# Patient Record
Sex: Female | Born: 1945 | Race: Black or African American | Hispanic: No | Smoking: Former smoker
Health system: Southern US, Community
[De-identification: ages and names within clinical notes are randomized; demographics above are authoritative.]

## PROBLEM LIST (undated history)

## (undated) DIAGNOSIS — I639 Cerebral infarction, unspecified: Secondary | ICD-10-CM

## (undated) DIAGNOSIS — I10 Essential (primary) hypertension: Secondary | ICD-10-CM

## (undated) DIAGNOSIS — I701 Atherosclerosis of renal artery: Secondary | ICD-10-CM

## (undated) DIAGNOSIS — I2510 Atherosclerotic heart disease of native coronary artery without angina pectoris: Secondary | ICD-10-CM

## (undated) DIAGNOSIS — N186 End stage renal disease: Secondary | ICD-10-CM

## (undated) DIAGNOSIS — N185 Chronic kidney disease, stage 5: Secondary | ICD-10-CM

## (undated) DIAGNOSIS — E1122 Type 2 diabetes mellitus with diabetic chronic kidney disease: Secondary | ICD-10-CM

## (undated) DIAGNOSIS — I219 Acute myocardial infarction, unspecified: Secondary | ICD-10-CM

## (undated) DIAGNOSIS — M1990 Unspecified osteoarthritis, unspecified site: Secondary | ICD-10-CM

## (undated) HISTORY — PX: RENAL ARTERY ANGIOPLASTY: SHX2317

## (undated) HISTORY — PX: CORONARY ANGIOPLASTY WITH STENT PLACEMENT: SHX49

## (undated) HISTORY — PX: TOTAL VAGINAL HYSTERECTOMY: SHX2548

---

## 2012-01-27 ENCOUNTER — Ambulatory Visit: Payer: Self-pay | Admitting: Internal Medicine

## 2012-02-24 ENCOUNTER — Ambulatory Visit: Payer: Self-pay | Admitting: Internal Medicine

## 2012-04-02 ENCOUNTER — Emergency Department: Payer: Self-pay | Admitting: Unknown Physician Specialty

## 2012-04-02 LAB — CBC
HCT: 33 % — ABNORMAL LOW (ref 35.0–47.0)
HGB: 11 g/dL — ABNORMAL LOW (ref 12.0–16.0)
MCH: 31.1 pg (ref 26.0–34.0)
MCHC: 33.4 g/dL (ref 32.0–36.0)
Platelet: 204 10*3/uL (ref 150–440)
RDW: 14.3 % (ref 11.5–14.5)

## 2012-04-02 LAB — URINALYSIS, COMPLETE
Bacteria: NONE SEEN
Glucose,UR: 50 mg/dL (ref 0–75)
Leukocyte Esterase: NEGATIVE
Nitrite: NEGATIVE
RBC,UR: 1 /HPF (ref 0–5)
Specific Gravity: 1.01 (ref 1.003–1.030)
Squamous Epithelial: 4
WBC UR: 1 /HPF (ref 0–5)

## 2012-04-02 LAB — COMPREHENSIVE METABOLIC PANEL
Anion Gap: 8 (ref 7–16)
BUN: 36 mg/dL — ABNORMAL HIGH (ref 7–18)
Bilirubin,Total: 0.2 mg/dL (ref 0.2–1.0)
Calcium, Total: 9 mg/dL (ref 8.5–10.1)
Chloride: 110 mmol/L — ABNORMAL HIGH (ref 98–107)
Co2: 26 mmol/L (ref 21–32)
Creatinine: 2.52 mg/dL — ABNORMAL HIGH (ref 0.60–1.30)
EGFR (African American): 22 — ABNORMAL LOW
EGFR (Non-African Amer.): 19 — ABNORMAL LOW
Osmolality: 299 (ref 275–301)
Potassium: 3.8 mmol/L (ref 3.5–5.1)
SGPT (ALT): 17 U/L (ref 12–78)
Sodium: 144 mmol/L (ref 136–145)
Total Protein: 7.4 g/dL (ref 6.4–8.2)

## 2013-06-26 ENCOUNTER — Ambulatory Visit: Payer: Self-pay | Admitting: Internal Medicine

## 2013-06-26 LAB — RAPID INFLUENZA A&B ANTIGENS

## 2014-11-25 ENCOUNTER — Encounter: Payer: Self-pay | Admitting: Emergency Medicine

## 2014-11-25 ENCOUNTER — Emergency Department: Payer: Medicare Other

## 2014-11-25 ENCOUNTER — Observation Stay
Admission: EM | Admit: 2014-11-25 | Discharge: 2014-11-27 | Disposition: A | Discharge status: Home / Self Care | Payer: Medicare Other | Attending: Internal Medicine | Admitting: Internal Medicine

## 2014-11-25 DIAGNOSIS — Z882 Allergy status to sulfonamides status: Secondary | ICD-10-CM | POA: Diagnosis not present

## 2014-11-25 DIAGNOSIS — I701 Atherosclerosis of renal artery: Secondary | ICD-10-CM | POA: Insufficient documentation

## 2014-11-25 DIAGNOSIS — I2510 Atherosclerotic heart disease of native coronary artery without angina pectoris: Secondary | ICD-10-CM | POA: Diagnosis present

## 2014-11-25 DIAGNOSIS — R7989 Other specified abnormal findings of blood chemistry: Secondary | ICD-10-CM

## 2014-11-25 DIAGNOSIS — Z8249 Family history of ischemic heart disease and other diseases of the circulatory system: Secondary | ICD-10-CM | POA: Diagnosis not present

## 2014-11-25 DIAGNOSIS — Z803 Family history of malignant neoplasm of breast: Secondary | ICD-10-CM | POA: Diagnosis not present

## 2014-11-25 DIAGNOSIS — Z955 Presence of coronary angioplasty implant and graft: Secondary | ICD-10-CM | POA: Insufficient documentation

## 2014-11-25 DIAGNOSIS — Z823 Family history of stroke: Secondary | ICD-10-CM | POA: Insufficient documentation

## 2014-11-25 DIAGNOSIS — Z7982 Long term (current) use of aspirin: Secondary | ICD-10-CM | POA: Insufficient documentation

## 2014-11-25 DIAGNOSIS — R739 Hyperglycemia, unspecified: Secondary | ICD-10-CM | POA: Diagnosis present

## 2014-11-25 DIAGNOSIS — N289 Disorder of kidney and ureter, unspecified: Secondary | ICD-10-CM | POA: Diagnosis present

## 2014-11-25 DIAGNOSIS — I200 Unstable angina: Secondary | ICD-10-CM | POA: Diagnosis not present

## 2014-11-25 DIAGNOSIS — Z794 Long term (current) use of insulin: Secondary | ICD-10-CM | POA: Diagnosis not present

## 2014-11-25 DIAGNOSIS — E1165 Type 2 diabetes mellitus with hyperglycemia: Secondary | ICD-10-CM | POA: Diagnosis not present

## 2014-11-25 DIAGNOSIS — M79602 Pain in left arm: Secondary | ICD-10-CM | POA: Diagnosis not present

## 2014-11-25 DIAGNOSIS — I120 Hypertensive chronic kidney disease with stage 5 chronic kidney disease or end stage renal disease: Secondary | ICD-10-CM | POA: Insufficient documentation

## 2014-11-25 DIAGNOSIS — Z833 Family history of diabetes mellitus: Secondary | ICD-10-CM | POA: Diagnosis not present

## 2014-11-25 DIAGNOSIS — I252 Old myocardial infarction: Secondary | ICD-10-CM | POA: Diagnosis not present

## 2014-11-25 DIAGNOSIS — E785 Hyperlipidemia, unspecified: Secondary | ICD-10-CM | POA: Diagnosis not present

## 2014-11-25 DIAGNOSIS — I25110 Atherosclerotic heart disease of native coronary artery with unstable angina pectoris: Secondary | ICD-10-CM | POA: Diagnosis not present

## 2014-11-25 DIAGNOSIS — Z79899 Other long term (current) drug therapy: Secondary | ICD-10-CM | POA: Insufficient documentation

## 2014-11-25 DIAGNOSIS — Z8489 Family history of other specified conditions: Secondary | ICD-10-CM | POA: Insufficient documentation

## 2014-11-25 DIAGNOSIS — E1122 Type 2 diabetes mellitus with diabetic chronic kidney disease: Secondary | ICD-10-CM | POA: Diagnosis present

## 2014-11-25 DIAGNOSIS — Z888 Allergy status to other drugs, medicaments and biological substances status: Secondary | ICD-10-CM | POA: Diagnosis not present

## 2014-11-25 DIAGNOSIS — I10 Essential (primary) hypertension: Secondary | ICD-10-CM | POA: Diagnosis present

## 2014-11-25 DIAGNOSIS — Z87891 Personal history of nicotine dependence: Secondary | ICD-10-CM | POA: Diagnosis not present

## 2014-11-25 DIAGNOSIS — N186 End stage renal disease: Secondary | ICD-10-CM | POA: Insufficient documentation

## 2014-11-25 DIAGNOSIS — R778 Other specified abnormalities of plasma proteins: Secondary | ICD-10-CM

## 2014-11-25 DIAGNOSIS — Z8673 Personal history of transient ischemic attack (TIA), and cerebral infarction without residual deficits: Secondary | ICD-10-CM | POA: Diagnosis not present

## 2014-11-25 DIAGNOSIS — R079 Chest pain, unspecified: Secondary | ICD-10-CM

## 2014-11-25 HISTORY — DX: Atherosclerotic heart disease of native coronary artery without angina pectoris: I25.10

## 2014-11-25 HISTORY — DX: Chronic kidney disease, stage 5: N18.5

## 2014-11-25 HISTORY — DX: Type 2 diabetes mellitus with diabetic chronic kidney disease: E11.22

## 2014-11-25 HISTORY — DX: Cerebral infarction, unspecified: I63.9

## 2014-11-25 HISTORY — DX: Acute myocardial infarction, unspecified: I21.9

## 2014-11-25 HISTORY — DX: Atherosclerosis of renal artery: I70.1

## 2014-11-25 HISTORY — DX: End stage renal disease: N18.6

## 2014-11-25 HISTORY — DX: Unspecified osteoarthritis, unspecified site: M19.90

## 2014-11-25 HISTORY — DX: Essential (primary) hypertension: I10

## 2014-11-25 LAB — CBC
HCT: 29.4 % — ABNORMAL LOW (ref 35.0–47.0)
Hemoglobin: 9.5 g/dL — ABNORMAL LOW (ref 12.0–16.0)
MCH: 29.2 pg (ref 26.0–34.0)
MCHC: 32.2 g/dL (ref 32.0–36.0)
MCV: 90.7 fL (ref 80.0–100.0)
Platelets: 159 10*3/uL (ref 150–440)
RBC: 3.24 MIL/uL — AB (ref 3.80–5.20)
RDW: 15.6 % — ABNORMAL HIGH (ref 11.5–14.5)
WBC: 3.5 10*3/uL — AB (ref 3.6–11.0)

## 2014-11-25 LAB — PROTIME-INR
INR: 1
PROTHROMBIN TIME: 13.4 s (ref 11.4–15.0)

## 2014-11-25 MED ORDER — NITROGLYCERIN 0.4 MG SL SUBL: 0.4000 mg | SUBLINGUAL_TABLET | SUBLINGUAL | Status: DC | PRN

## 2014-11-25 MED ORDER — NITROGLYCERIN 2 % TD OINT
1.0000 [in_us] | TOPICAL_OINTMENT | Freq: Once | TRANSDERMAL | Status: AC
  Administered 2014-11-26: 1 [in_us] via TOPICAL

## 2014-11-25 NOTE — ED Notes (Signed)
Pt presents to ED via EMS from personal home with c/o of mid-sternal chest pain radiating to left side of chest/shoulder. EMS states pt has a hx of an MI. EMS states pt has been experiencing these sx intermittently since earlier today. EMS states pt describes pain as sharp/dull, with a rating of 5/10. EMS administered x1 nitroglycerin sublingual tablet and 324 mg of aspirin in route to ER, with a declining pain rating. VS per EMS listed below:   310 CBG  92 HR  98.4 Temperature  96% O2 RA  160/82 BP

## 2014-11-25 NOTE — ED Provider Notes (Signed)
[facility] Emergency Department Provider Note  ____________________________________________  Time seen: Approximately 11:18 PM  I have reviewed the triage vital signs and the nursing notes.   HISTORY  Chief Complaint Chest Pain    HPI Loretta Kaiser is a 69 y.o. female who presents via EMS for chest pain. Patient states she was getting ready for bed when she experienced gradual onset left-sided chest pressure prior to arrival. Patient rates pain 10/10 prior to EMS arrival. CP associated with shortness of breath; patient denies associated diaphoresis, nausea, dizziness, palpitations. Patient received 324 mg aspirin and 1 sublingual nitroglycerin per EMS with partial relief, radiating CP now 5/10. Patient states pain is similar to her prior MI.  Past medical history . CAD (coronary artery disease) 2004  . Renal artery stenosis (RAF-HCC) 2007  . Prolapsed uterus  . Type II diabetes mellitus (RAF-HCC)  . Hypertension  . Renal artery stenosis (RAF-HCC)   Patient Active Problem List   Diagnosis Date Noted  . Unstable angina 11/26/2014  . 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 CAD (coronary artery disease), native coronary artery 11/26/2014  . Benign essential HTN 11/26/2014  . HLD (hyperlipidemia) 11/26/2014    Past surgical history S/p 2007 Left renal artery angioplasty w/stent placement  S/p March 2004 MI & subsequent angioplasty w/stent placement   Medications . amLODIPine (NORVASC) 10 MG tablet Take 1 tablet (10 mg total) by mouth daily. 90 tablet 1  . aspirin (ECOTRIN) 81 MG tablet Take 81 mg by mouth daily. Take 81 mg by mouth daily.  . blood sugar diagnostic (ONE TOUCH ULTRA TEST) Strp Apply 1 each topically Three (3) times a day. (Use to test blood sugar ICD 250.00)(Pt on Insulin)  . blood-glucose meter (ONE TOUCH ULTRA SYSTEM KIT) kit Apply 1 each topically daily. Use as instructed  . carvedilol (COREG) 12.5 MG tablet  Take 1 tablet (12.5 mg total) by mouth Two (2) times a day. 180 tablet 3  . coenzyme Q10 (CO Q-10) 100 mg capsule Take 100 mg by mouth once daily.  Marland Kitchen donepezil (ARICEPT) 5 MG tablet Take 1 tablet (5 mg total) by mouth nightly. 90 tablet 3  . fluticasone (FLONASE) 50 mcg/actuation nasal spray USE ONE SPRAY IN EACH NOSTRIL ONCE DAILY 16 g 1  . furosemide (LASIX) 20 MG tablet Take 1 tablet (20 mg total) by mouth daily. 30 tablet 11  . insulin glargine (LANTUS) 100 unit/mL (3 mL) injection pen Inject 40 units under skin nightly 15 mL 4  . insulin lispro (HUMALOG KWIKPEN) 100 unit/mL injection pen Inject 12 units under skin three times daily before meals 10 mL 12  . insulin needles, disposable, (PEN NEEDLE) 31 X 1/4 " Ndle Take as directed 100 each 2  . lancets Misc  . mirtazapine (REMERON) 15 MG tablet Take 1 tab by mouth at bedtime 90 tablet 1  . multivitamin (THERAGRAN) per tablet Take 1 tablet by mouth once daily.  Marland Kitchen omega-3 fatty acids-fish oil (FISH OIL EXTRA STRENGTH) 435-880 mg cap Take 3 tablets by mouth daily. Frequency:QD Dosage:0.0 Instructions: Note:Dose: ONE TABLET  . rosuvastatin (CRESTOR) 20 MG tablet Take 1 tablet (20 mg total) by mouth daily. 90 tablet 3  . sodium bicarbonate 650 mg tablet Take 2 tablets (1,300 mg total) by mouth Two (2) times a day. 120 tablet 11   Allergies Sulfa (sulfonamide antibiotics); Sulfur; Ace inhibitors; Arb-angiotensin receptor antagonist; Lipitor; and Simvastatin  Family history CAD   Social History History  Substance Use Topics  . Smoking status: Former Smoker    Quit date: 06/28/1986  . Smokeless tobacco: Not on file  . Alcohol Use: No  Former smoker  Review of Systems Constitutional: No fever/chills Eyes: No visual changes. ENT: No sore throat. Cardiovascular: Positive for chest pain. Respiratory: Positive for shortness of breath. Gastrointestinal: No abdominal pain.  No nausea, no vomiting.  No diarrhea.  No  constipation. Genitourinary: Negative for dysuria. Musculoskeletal: Negative for back pain. Positive for BLE swelling. Skin: Negative for rash. Neurological: Negative for headaches, focal weakness or numbness.  10-point ROS otherwise negative.  ____________________________________________   PHYSICAL EXAM:  VITAL SIGNS: ED Triage Vitals  Enc Vitals Group     BP --      Pulse --      Resp --      Temp --      Temp src --      SpO2 --      Weight --      Height --      Head Cir --      Peak Flow --      Pain Score --      Pain Loc --      Pain Edu? --      Excl. in [place]? --     Constitutional: Alert and oriented. Well appearing and in no acute distress. Eyes: Conjunctivae are normal. PERRL. EOMI. Head: Atraumatic. Nose: No congestion/rhinnorhea. Mouth/Throat: Mucous membranes are moist.  Oropharynx non-erythematous. Neck: No stridor.   Cardiovascular: Normal rate, regular rhythm. Grossly normal heart sounds.  Good peripheral circulation. Respiratory: Normal respiratory effort.  No retractions. Lungs CTAB. Gastrointestinal: Soft and nontender. No distention. No abdominal bruits. No CVA tenderness. Musculoskeletal: No lower extremity tenderness nor edema.  No joint effusions. Neurologic:  Normal speech and language. No gross focal neurologic deficits are appreciated. Speech is normal. No gait instability. Skin:  Skin is warm, dry and intact. No rash noted. Psychiatric: Mood and affect are normal. Speech and behavior are normal.  ____________________________________________   LABS (all labs ordered are listed, but only abnormal results are displayed)  Labs Reviewed  BASIC METABOLIC PANEL - Abnormal; Notable for the following:    Glucose, Bld 357 (*)    BUN 49 (*)    Creatinine, Ser 5.48 (*)    Calcium 8.3 (*)    GFR calc non Af Amer 7 (*)    GFR calc Af Amer 8 (*)    All other components within normal limits  CBC - Abnormal; Notable for the following:    WBC 3.5  (*)    RBC 3.24 (*)    Hemoglobin 9.5 (*)    HCT 29.4 (*)    RDW 15.6 (*)    All other components within normal limits  TROPONIN I - Abnormal; Notable for the following:    Troponin I 0.05 (*)    All other components within normal limits  GLUCOSE, CAPILLARY - Abnormal; Notable for the following:    Glucose-Capillary 251 (*)    All other components within normal limits  PROTIME-INR  TROPONIN I  TROPONIN I  TROPONIN I  BASIC METABOLIC PANEL  CBC   ____________________________________________  EKG  ED ECG REPORT I, SUNG,JADE J, the attending physician, personally viewed and interpreted this ECG.   Date: 11/25/2014  EKG Time: 2319  Rate: 82  Rhythm: normal EKG, normal sinus rhythm  Axis: Normal  Intervals:first-degree A-V block   ST&T Change: Nonspecific  ____________________________________________  RADIOLOGY  Portable chest x-ray (viewed by me, interpreted per Dr. Marisue Humble): Cardiomegaly with mild vascular congestion. ____________________________________________   PROCEDURES  Procedure(s) performed: None  Critical Care performed: No  ____________________________________________   INITIAL IMPRESSION / ASSESSMENT AND PLAN / ED COURSE  Pertinent labs & imaging results that were available during my care of the patient were reviewed by me and considered in my medical decision making (see chart for details).  69 year old female who presents with left-sided chest pressure similar to prior MI. Pain currently 5/10 after aspirin and 1 sublingual nitroglycerin per EMS. Will continue nitroglycerin for relief of chest discomfort, check labs including troponin, chest x-ray; anticipate admission for chest pain rule out. ____________________________________________   FINAL CLINICAL IMPRESSION(S) / ED DIAGNOSES  Final diagnoses:  Chest pain, unspecified chest pain type  Renal insufficiency  Elevated troponin  Hyperglycemia      Paulette Blanch, MD 11/26/14 [phone]

## 2014-11-26 ENCOUNTER — Encounter: Payer: Self-pay | Admitting: Internal Medicine

## 2014-11-26 ENCOUNTER — Observation Stay
Admit: 2014-11-26 | Discharge: 2014-11-26 | Disposition: A | Discharge status: Home / Self Care | Payer: Medicare Other | Attending: Internal Medicine | Admitting: Internal Medicine

## 2014-11-26 DIAGNOSIS — E785 Hyperlipidemia, unspecified: Secondary | ICD-10-CM | POA: Diagnosis present

## 2014-11-26 DIAGNOSIS — I10 Essential (primary) hypertension: Secondary | ICD-10-CM | POA: Diagnosis present

## 2014-11-26 DIAGNOSIS — I200 Unstable angina: Secondary | ICD-10-CM | POA: Diagnosis present

## 2014-11-26 DIAGNOSIS — E1122 Type 2 diabetes mellitus with diabetic chronic kidney disease: Secondary | ICD-10-CM | POA: Diagnosis present

## 2014-11-26 DIAGNOSIS — I2510 Atherosclerotic heart disease of native coronary artery without angina pectoris: Secondary | ICD-10-CM | POA: Diagnosis present

## 2014-11-26 DIAGNOSIS — N186 End stage renal disease: Secondary | ICD-10-CM

## 2014-11-26 LAB — BASIC METABOLIC PANEL
ANION GAP: 5 (ref 5–15)
Anion gap: 9 (ref 5–15)
BUN: 49 mg/dL — ABNORMAL HIGH (ref 6–20)
BUN: 47 mg/dL — ABNORMAL HIGH (ref 6–20)
CO2: 24 mmol/L (ref 22–32)
CO2: 23 mmol/L (ref 22–32)
CREATININE: 5.02 mg/dL — AB (ref 0.44–1.00)
Calcium: 8.3 mg/dL — ABNORMAL LOW (ref 8.9–10.3)
Calcium: 8.3 mg/dL — ABNORMAL LOW (ref 8.9–10.3)
Chloride: 106 mmol/L (ref 101–111)
Chloride: 111 mmol/L (ref 101–111)
Creatinine, Ser: 5.48 mg/dL — ABNORMAL HIGH (ref 0.44–1.00)
GFR calc Af Amer: 8 mL/min — ABNORMAL LOW (ref >60)
GFR calc non Af Amer: 8 mL/min — ABNORMAL LOW (ref >60)
GFR, EST AFRICAN AMERICAN: 9 mL/min — AB (ref >60)
GFR, EST NON AFRICAN AMERICAN: 7 mL/min — AB (ref >60)
Glucose, Bld: 357 mg/dL — ABNORMAL HIGH (ref 65–99)
Glucose, Bld: 251 mg/dL — ABNORMAL HIGH (ref 65–99)
Potassium: 4.4 mmol/L (ref 3.5–5.1)
Potassium: 4.5 mmol/L (ref 3.5–5.1)
SODIUM: 139 mmol/L (ref 135–145)
Sodium: 139 mmol/L (ref 135–145)

## 2014-11-26 LAB — CBC
HEMATOCRIT: 30.2 % — AB (ref 35.0–47.0)
HEMOGLOBIN: 9.6 g/dL — AB (ref 12.0–16.0)
MCH: 29 pg (ref 26.0–34.0)
MCHC: 31.9 g/dL — ABNORMAL LOW (ref 32.0–36.0)
MCV: 91 fL (ref 80.0–100.0)
Platelets: 158 10*3/uL (ref 150–440)
RBC: 3.32 MIL/uL — AB (ref 3.80–5.20)
RDW: 15.5 % — ABNORMAL HIGH (ref 11.5–14.5)
WBC: 4.3 10*3/uL (ref 3.6–11.0)

## 2014-11-26 LAB — TROPONIN I
TROPONIN I: 0.05 ng/mL — AB (ref <0.031)
TROPONIN I: 0.77 ng/mL — AB (ref <0.031)
Troponin I: 0.7 ng/mL — ABNORMAL HIGH (ref <0.031)

## 2014-11-26 LAB — GLUCOSE, CAPILLARY
GLUCOSE-CAPILLARY: 251 mg/dL — AB (ref 65–99)
Glucose-Capillary: 145 mg/dL — ABNORMAL HIGH (ref 65–99)
Glucose-Capillary: 159 mg/dL — ABNORMAL HIGH (ref 65–99)
Glucose-Capillary: 164 mg/dL — ABNORMAL HIGH (ref 65–99)
Glucose-Capillary: 234 mg/dL — ABNORMAL HIGH (ref 65–99)
Glucose-Capillary: 170 mg/dL — ABNORMAL HIGH (ref 65–99)

## 2014-11-26 MED ORDER — DONEPEZIL HCL 5 MG PO TABS
5.0000 mg | ORAL_TABLET | Freq: Every day | ORAL | Status: DC
  Administered 2014-11-26: 5 mg via ORAL
  Filled 2014-11-26: qty 1

## 2014-11-26 MED ORDER — FUROSEMIDE 20 MG PO TABS
20.0000 mg | ORAL_TABLET | Freq: Every day | ORAL | Status: DC
  Administered 2014-11-26 – 2014-11-27 (×2): 20 mg via ORAL
  Filled 2014-11-26 (×2): qty 1

## 2014-11-26 MED ORDER — MIRTAZAPINE 15 MG PO TABS
15.0000 mg | ORAL_TABLET | Freq: Every day | ORAL | Status: DC
  Administered 2014-11-26: 15 mg via ORAL
  Filled 2014-11-26: qty 1

## 2014-11-26 MED ORDER — SENNOSIDES-DOCUSATE SODIUM 8.6-50 MG PO TABS
1.0000 | ORAL_TABLET | Freq: Every evening | ORAL | Status: DC | PRN
Start: 2014-11-26 — End: 2014-11-27

## 2014-11-26 MED ORDER — NITROGLYCERIN 2 % TD OINT
TOPICAL_OINTMENT | TRANSDERMAL | Status: AC
  Administered 2014-11-26: 1 [in_us] via TOPICAL
  Filled 2014-11-26: qty 1

## 2014-11-26 MED ORDER — INSULIN ASPART 100 UNIT/ML ~~LOC~~ SOLN
0.0000 [IU] | Freq: Four times a day (QID) | SUBCUTANEOUS | Status: DC
  Administered 2014-11-26: 3 [IU] via SUBCUTANEOUS
  Administered 2014-11-26 – 2014-11-27 (×3): 2 [IU] via SUBCUTANEOUS
  Filled 2014-11-26 (×3): qty 2
  Filled 2014-11-26: qty 3

## 2014-11-26 MED ORDER — ACETAMINOPHEN 650 MG RE SUPP: 650.0000 mg | Freq: Four times a day (QID) | RECTAL | Status: DC | PRN

## 2014-11-26 MED ORDER — INSULIN ASPART 100 UNIT/ML ~~LOC~~ SOLN
SUBCUTANEOUS | Status: AC
  Administered 2014-11-26: 10 [IU] via SUBCUTANEOUS
  Filled 2014-11-26: qty 10

## 2014-11-26 MED ORDER — AMLODIPINE BESYLATE 10 MG PO TABS
10.0000 mg | ORAL_TABLET | Freq: Every day | ORAL | Status: DC
  Administered 2014-11-26 – 2014-11-27 (×2): 10 mg via ORAL
  Filled 2014-11-26 (×2): qty 1

## 2014-11-26 MED ORDER — SIMETHICONE 80 MG PO CHEW
80.0000 mg | CHEWABLE_TABLET | Freq: Four times a day (QID) | ORAL | Status: DC
  Administered 2014-11-26 – 2014-11-27 (×3): 80 mg via ORAL
  Filled 2014-11-26 (×3): qty 1

## 2014-11-26 MED ORDER — ASPIRIN EC 81 MG PO TBEC
81.0000 mg | DELAYED_RELEASE_TABLET | Freq: Every day | ORAL | Status: DC
  Administered 2014-11-26 – 2014-11-27 (×2): 81 mg via ORAL
  Filled 2014-11-26 (×2): qty 1

## 2014-11-26 MED ORDER — ACETAMINOPHEN 325 MG PO TABS: 650.0000 mg | ORAL_TABLET | Freq: Four times a day (QID) | ORAL | Status: DC | PRN

## 2014-11-26 MED ORDER — ROSUVASTATIN CALCIUM 20 MG PO TABS
20.0000 mg | ORAL_TABLET | Freq: Every day | ORAL | Status: DC
  Administered 2014-11-26 – 2014-11-27 (×2): 20 mg via ORAL
  Filled 2014-11-26 (×2): qty 1

## 2014-11-26 MED ORDER — HEPARIN SODIUM (PORCINE) 5000 UNIT/ML IJ SOLN
5000.0000 [IU] | Freq: Three times a day (TID) | INTRAMUSCULAR | Status: DC
  Administered 2014-11-26 – 2014-11-27 (×4): 5000 [IU] via SUBCUTANEOUS
  Filled 2014-11-26 (×3): qty 1

## 2014-11-26 MED ORDER — CARVEDILOL 12.5 MG PO TABS
12.5000 mg | ORAL_TABLET | Freq: Two times a day (BID) | ORAL | Status: DC
  Administered 2014-11-26 – 2014-11-27 (×3): 12.5 mg via ORAL
  Filled 2014-11-26 (×3): qty 1

## 2014-11-26 MED ORDER — ONDANSETRON HCL 4 MG/2ML IJ SOLN: 4.0000 mg | Freq: Four times a day (QID) | INTRAMUSCULAR | Status: DC | PRN

## 2014-11-26 MED ORDER — ONDANSETRON HCL 4 MG PO TABS: 4.0000 mg | ORAL_TABLET | Freq: Four times a day (QID) | ORAL | Status: DC | PRN

## 2014-11-26 MED ORDER — ISOSORBIDE MONONITRATE ER 30 MG PO TB24
30.0000 mg | ORAL_TABLET | Freq: Every day | ORAL | Status: DC
  Administered 2014-11-27: 30 mg via ORAL
  Filled 2014-11-26: qty 1

## 2014-11-26 MED ORDER — INSULIN ASPART 100 UNIT/ML ~~LOC~~ SOLN
10.0000 [IU] | Freq: Once | SUBCUTANEOUS | Status: AC
  Administered 2014-11-26: 10 [IU] via SUBCUTANEOUS

## 2014-11-26 MED ORDER — SODIUM CHLORIDE 0.9 % IJ SOLN
3.0000 mL | Freq: Two times a day (BID) | INTRAMUSCULAR | Status: DC
  Administered 2014-11-26 – 2014-11-27 (×4): 3 mL via INTRAVENOUS

## 2014-11-26 NOTE — Progress Notes (Signed)
[facility] [place] [facility]   PATIENT NAME: Loretta Kaiser    MR#:  [id number]  DATE OF BIRTH:  1945-11-03  SUBJECTIVE:Admitted for chest pain.now chest pain free.troponin elevated at 0.70,  CHIEF COMPLAINT:   Chief Complaint  Patient presents with  . Chest Pain    REVIEW OF SYSTEMS:   ROS CONSTITUTIONAL: No fever, fatigue or weakness.  EYES: No blurred or double vision.  EARS, NOSE, AND THROAT: No tinnitus or ear pain.  RESPIRATORY: No cough, shortness of breath, wheezing or hemoptysis.  CARDIOVASCULAR: No chest pain, orthopnea, edema.  GASTROINTESTINAL: No nausea, vomiting, diarrhea or abdominal pain.  GENITOURINARY: No dysuria, hematuria.  ENDOCRINE: No polyuria, nocturia,  HEMATOLOGY: No anemia, easy bruising or bleeding SKIN: No rash or lesion. MUSCULOSKELETAL: No joint pain or arthritis.   NEUROLOGIC: No tingling, numbness, weakness.  PSYCHIATRY: No anxiety or depression.   DRUG ALLERGIES:   Allergies  Allergen Reactions  . Ace Inhibitors     hyperkalemia  . Angiotensin Receptor Blockers     hyperkalemia  . Lipitor [Atorvastatin]     Muscle aches  . Sulfa Antibiotics Hives  . Simvastatin Rash    VITALS:  Blood pressure 173/54, pulse 78, temperature 98.1 F (36.7 C), temperature source Oral, resp. rate 20, height 5\' 5"  (1.651 m), weight 79.379 kg (175 lb), SpO2 99 %.  PHYSICAL EXAMINATION:  GENERAL:  69 y.o.-year-old patient lying in the bed with no acute distress.  EYES: Pupils equal, round, reactive to light and accommodation. No scleral icterus. Extraocular muscles intact.  HEENT: Head atraumatic, normocephalic. Oropharynx and nasopharynx clear.  NECK:  Supple, no jugular venous distention. No thyroid enlargement, no tenderness.  LUNGS: Normal breath sounds bilaterally, no wheezing, rales,rhonchi or crepitation. No use of accessory muscles of respiration.  CARDIOVASCULAR: S1, S2 normal. No murmurs, rubs, or gallops.   ABDOMEN: Soft, nontender, nondistended. Bowel sounds present. No organomegaly or mass.  EXTREMITIES: No pedal edema, cyanosis, or clubbing.  NEUROLOGIC: Cranial nerves II through XII are intact. Muscle strength 5/5 in all extremities. Sensation intact. Gait not checked.  PSYCHIATRIC: The patient is alert and oriented x 3.  SKIN: No obvious rash, lesion, or ulcer.    LABORATORY PANEL:   CBC  Recent Labs Lab 11/25/14 2321  WBC 3.5*  HGB 9.5*  HCT 29.4*  PLT 159   ------------------------------------------------------------------------------------------------------------------  Chemistries   Recent Labs Lab 11/25/14 2321  NA 139  K 4.4  CL 106  CO2 24  GLUCOSE 357*  BUN 49*  CREATININE 5.48*  CALCIUM 8.3*   ------------------------------------------------------------------------------------------------------------------  Cardiac Enzymes  Recent Labs Lab 11/26/14 0701  TROPONINI 0.70*   ------------------------------------------------------------------------------------------------------------------  RADIOLOGY:  Dg Chest Port 1 View  11/26/2014   CLINICAL DATA:  Left anterior chest pain for 1 day.  EXAM: PORTABLE CHEST - 1 VIEW  COMPARISON:  06/26/2013  FINDINGS: The heart is enlarged, question progressed versus related to differences in technique. There is atherosclerosis of the thoracic aorta. Mild central vascular congestion without overt edema. No large consolidation, pleural effusion or pneumothorax on portable exam.  IMPRESSION: Cardiomegaly with mild vascular congestion.   Electronically Signed   By: Rubye OaksMelanie  Ehinger M.D.   On: 11/26/2014 01:25    EKG:   Orders placed or performed during the hospital encounter of 11/25/14  . ED EKG  . ED EKG  . EKG 12-Lead  . EKG 12-Lead  . EKG 12-Lead  . EKG 12-Lead  . EKG    ASSESSMENT AND PLAN:  1.chest pain,NSTEMI;elevated troponin,concernign for Unstable angina;continue asa,bblcker,nitrates,cardio consult  pending,seen Dr.Callwood before 2.CKD stage 4;follow up with  [facility],on kidney transplant list,not on HD 3.htn;slightly high;continue amlodipine,coreg,lasix     All the records are reviewed and case discussed with Care Management/Social Workerr. Management plans discussed with the patient, family and they are in agreement.  CODE STATUS: full  TOTAL TIME TAKING CARE OF THIS PATIENT: 35 minutes.   POSSIBLE D/C IN  3-4 DAYS, DEPENDING ON CLINICAL CONDITION.   Katha Hamming M.D on 11/26/2014 at 12:15 PM  Between 7am to 6pm - Pager - [phone]  After 6pm go to [email or web address] - password [email or web address]  [facility]  Office  [phone]  CC: Primary care physician; Pcp Not In System

## 2014-11-26 NOTE — Progress Notes (Signed)
*  PRELIMINARY RESULTS* Echocardiogram 2D Echocardiogram has been performed.  Loretta Kaiser 11/26/2014, 9:27 AM

## 2014-11-26 NOTE — Progress Notes (Signed)
Spoke with dr. Lady Garyfath to make aware third troponin is 0.77. Per md patient is next on his list to consult. Patient currently resting in bed., no c/o chest pain. No distress noted. Will continue to monitor closely [facility] Us[facility] Monica Kaiser

## 2014-11-26 NOTE — Consult Note (Signed)
[facility] CL[facility]IC CARDIOLOGY      [facility]    Cardiology Consultation Note  Patient ID: Loretta Kaiser, MRN: [id number], DOB/AGE: April 30, 1946 69 y.o. Admit date: 11/25/2014   Date of Consult: 11/26/2014 Primary Physician: Pcp Not In System Primary Cardiologist: [facility]  Chief Complaint:  Chest pain Reason for Consult:  Abnormal troponin  HPI: 69 y.o. female with h/o  Patient is a 69 year old African American female with history of end-stage renal disease not yet on hemodialysis with a traumatic worsening in her renal function with a serum creatinine of 5.52.  She is being evaluated by in Nephrology in  [facility] with consideration for renal transplant. Per her report, she is not being scheduled for hemodialysis.  She presented to our hospital complaining of midsternal chest pain.  She has a history of PCI in the past.  She states features of the pain was similar to her angina.  She had a mild serum troponin elevation 0.77.  He EKG revealed nonspecific ST T wave changes.  Pain has improved.  She has a history of diabetes, hypertension, stage 5 chronic kidney disease.  She denies shortness of breath.  She reports compliance with her medications.  She is current being treated with amlodipine at 10 mg daily, carvedilol 12.5 mg twice daily, Crestor 20 mg daily.  Past Medical History  Diagnosis Date  . 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 Hypertension   . MI (myocardial infarction)   . Arthritis   . CVA (cerebral infarction)   . Renal artery stenosis   . CKD (chronic kidney disease), stage V   . CAD (coronary artery disease), native coronary artery       Most Recent Cardiac Studies:   Echocardiogram revealed normal LV function EF of 55% with no regional wall motion abnormalities.  She had a trace pericardial fusion with no any tamponade physiology.  No high-grade valvular abnormalities.   Surgical History:  Past Surgical History  Procedure Laterality  Date  . Coronary angioplasty with stent placement    . Renal artery angioplasty      with stent placement  . Total vaginal hysterectomy       Home Meds: Prior to Admission medications   Medication Sig Start Date End Date Taking? Authorizing Provider  amLODipine (NORVASC) 10 MG tablet Take 10 mg by mouth daily.   Yes Historical Provider, MD  aspirin EC 81 MG tablet Take 81 mg by mouth daily.   Yes Historical Provider, MD  carvedilol (COREG) 12.5 MG tablet Take 12.5 mg by mouth 2 (two) times daily with a meal.   Yes Historical Provider, MD  donepezil (ARICEPT) 5 MG tablet Take 5 mg by mouth at bedtime.   Yes Historical Provider, MD  furosemide (LASIX) 20 MG tablet Take 20 mg by mouth daily.   Yes Historical Provider, MD  insulin glargine (LANTUS) 100 UNIT/ML injection Inject 40 Units into the skin at bedtime.   Yes Historical Provider, MD  insulin lispro (HUMALOG) 100 UNIT/ML injection Inject 12 Units into the skin 3 (three) times daily with meals.   Yes Historical Provider, MD  mirtazapine (REMERON) 15 MG tablet Take 15 mg by mouth at bedtime.   Yes Historical Provider, MD  rosuvastatin (CRESTOR) 20 MG tablet Take 20 mg by mouth daily.   Yes Historical Provider, MD    Inpatient Medications:  . amLODipine  10 mg Oral Daily  . aspirin EC  81 mg Oral Daily  .  carvedilol  12.5 mg Oral BID WC  . donepezil  5 mg Oral QHS  . furosemide  20 mg Oral Daily  . heparin  5,000 Units Subcutaneous 3 times per day  . insulin aspart  0-9 Units Subcutaneous Q6H  . mirtazapine  15 mg Oral QHS  . rosuvastatin  20 mg Oral Daily  . sodium chloride  3 mL Intravenous Q12H      Allergies:  Allergies  Allergen Reactions  . Ace Inhibitors     hyperkalemia  . Angiotensin Receptor Blockers     hyperkalemia  . Lipitor [Atorvastatin]     Muscle aches  . Sulfa Antibiotics Hives  . Simvastatin Rash    History   Social History  . Marital Status: Divorced    Spouse Name: N/A  . Number of Children:  N/A  . Years of Education: N/A   Occupational History  . Not on file.   Social History Main Topics  . Smoking status: Former Smoker    Quit date: 06/28/1986  . Smokeless tobacco: Not on file  . Alcohol Use: No  . Drug Use: No  . Sexual Activity: Not on file   Other Topics Concern  . Not on file   Social History Narrative     Family History  Problem Relation Age of Onset  . Breast cancer Mother   . Thyroid disease Mother   . Hypertension Father   . Heart disease Father   . Diabetes Sister   . Diabetes Brother   . Stroke Brother   . Thyroid disease Paternal Aunt   . Hyperlipidemia    . CAD       Review of Systems: General: negative for chills, fever, night sweats or weight changes.  Cardiovascular: negative for  paroxysmal nocturnal dyspnea, PAC complains of occasional episodes of chest pain and shortness of breath with dyspnea on exertion Dermatological: negative for rash Respiratory: negative for cough or wheezing Urologic: negative for hematuria Abdominal: negative for nausea, vomiting, diarrhea, bright red blood per rectum, melena, or hematemesis Neurologic: negative for visual changes, syncope, or dizziness All other systems reviewed and are otherwise negative except as noted above.  Labs:  Recent Labs  11/25/14 2321 11/26/14 0701 11/26/14 1259  TROPONINI 0.05* 0.70* 0.77*   Lab Results  Component Value Date   WBC 4.3 11/26/2014   HGB 9.6* 11/26/2014   HCT 30.2* 11/26/2014   MCV 91.0 11/26/2014   PLT 158 11/26/2014    Recent Labs Lab 11/26/14 1259  NA 139  K 4.5  CL 111  CO2 23  BUN 47*  CREATININE 5.02*  CALCIUM 8.3*  GLUCOSE 251*   No results found for: CHOL, HDL, LDLCALC, TRIG No results found for: DDIMER  Radiology/Studies:  Dg Chest Port 1 View  11/26/2014   CLINICAL DATA:  Left anterior chest pain for 1 day.  EXAM: PORTABLE CHEST - 1 VIEW  COMPARISON:  06/26/2013  FINDINGS: The heart is enlarged, question progressed versus related  to differences in technique. There is atherosclerosis of the thoracic aorta. Mild central vascular congestion without overt edema. No large consolidation, pleural effusion or pneumothorax on portable exam.  IMPRESSION: Cardiomegaly with mild vascular congestion.   Electronically Signed   By: Rubye OaksMelanie  Ehinger M.D.   On: 11/26/2014 01:25    EKG:  Sinus rhythm with nonspecific ST T wave changes.  Weights: Filed Weights   11/25/14 2324  Weight: 79.379 kg (175 lb)     Physical Exam: Blood pressure 149/51, pulse  69, temperature 98.2 F (36.8 C), temperature source Oral, resp. rate 18, height  (1.651 m), weight 79.379 kg (175 lb), SpO2 98 %. Body mass index is 29.12 kg/(m^2). General: Well developed, well nourished, in no acute distress. Head: Normocephalic, atraumatic, sclera non-icteric, no xanthomas, nares are without discharge.  Neck: Negative for carotid bruits. JVD not elevated. Lungs: Clear bilaterally to auscultation without wheezes, rales, or rhonchi. Breathing is unlabored. Heart: RRR with S1 S2. No murmurs, rubs, or gallops appreciated. Abdomen: Soft, non-tender, non-distended with normoactive bowel sounds. No hepatomegaly. No rebound/guarding. No obvious abdominal masses. Msk:  Strength and tone appear normal for age. Extremities: No clubbing or cyanosis. No edema.  Distal pedal pulses are 2+ and equal bilaterally. Neuro: Alert and oriented X 3. No facial asymmetry. No focal deficit. Moves all extremities spontaneously. Psych:  Responds to questions appropriately with a normal affect.    Assessment and Plan:   Patient with end-stage renal disease with serum creatinine over 5 but not on hemodialysis.  She is being considered for renal transplant per patient's report.  This is being done in [place].  She has chest pain with features both typical and atypical of her angina.  She has a mild serum troponin elevation.  The troponin elevation is somewhat concerning but may be  related to her renal function.  She is not a candidate for catheterization given her severely  Reduced renal function and no current hemodialysis or plans for this.  Will attempt to control medically given this with continued use of beta-blockers aspirin.  Will add long-acting nitrates and follow she.  Should she go on hemodialysis consideration could be made for repeat invasive evaluation however would defer this at present.  Signed, Darlin Priestly Accalia Rigdon MD [facility]   11/26/2014, 3:06 PM

## 2014-11-26 NOTE — Progress Notes (Signed)
Picked up pt from [facility] at 3pm today. No changes in pt statu. Pt remains chest pain free.

## 2014-11-26 NOTE — H&P (Signed)
[facility] [place] [facility]   PATIENT NAME: Loretta Kaiser    MR#:  [id number]  DATE OF BIRTH:  [birth year]  DATE OF ADMISSION:  11/25/2014  PRIMARY CARE PHYSICIAN: Pcp Not In System   REQUESTING/REFERRING PHYSICIAN: Dolores Kaiser  CHIEF COMPLAINT:   Chief Complaint  Patient presents with  . Chest Pain    HISTORY OF PRESENT ILLNESS:  Loretta Kaiser  is a 69 y.o. female who presents with chest pain. Patient has a known history of CAD, with prior stent placement status post prior MI. She states that she was sitting in her house this afternoon, and began to experience central substernal chest pain. She describes the pain as sharp and also pressure-like, with radiation to her left arm. She denies any nausea or vomiting, abdominal pain, diaphoresis, headache, blurred vision. She states that she tried to lay down to help alleviate the pain, but she did also have some shortness of breath with it, and just could not get comfortable. She decided to call EMS, who gave her aspirin and sublingual nitroglycerin when they arrived. Patient states that the sublingual nitroglycerin alleviated her pain. In the ED she was found to have a troponin mildly elevated to 0.05. Hospitalists were called for admission for unstable angina. Of note, the patient also has end-stage renal disease. However she states that she still makes good urine, and is following with her nephrologist and transplant team. She does not currently undergo hemodialysis regularly.  PAST MEDICAL HISTORY:   Past Medical History  Diagnosis Date  . 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 Hypertension   . MI (myocardial infarction)   . Arthritis   . CVA (cerebral infarction)   . Renal artery stenosis   . CKD (chronic kidney disease), stage V   . CAD (coronary artery disease), native coronary artery     PAST SURGICAL HISTORY:   Past Surgical History  Procedure Laterality Date  . Coronary angioplasty  with stent placement    . Renal artery angioplasty      with stent placement  . Total vaginal hysterectomy      SOCIAL HISTORY:   History  Substance Use Topics  . Smoking status: Former Smoker    Quit date: 06/28/1986  . Smokeless tobacco: Not on file  . Alcohol Use: No    FAMILY HISTORY:   Family History  Problem Relation Age of Onset  . Breast cancer Mother   . Thyroid disease Mother   . Hypertension Father   . Heart disease Father   . Diabetes Sister   . Diabetes Brother   . Stroke Brother   . Thyroid disease Paternal Aunt   . Hyperlipidemia    . CAD      DRUG ALLERGIES:   Allergies  Allergen Reactions  . Ace Inhibitors     hyperkalemia  . Angiotensin Receptor Blockers     hyperkalemia  . Lipitor [Atorvastatin]     Muscle aches  . Sulfa Antibiotics Hives  . Simvastatin Rash    MEDICATIONS AT HOME:   Prior to Admission medications   Medication Sig Start Date End Date Taking? Authorizing Provider  amLODipine (NORVASC) 10 MG tablet Take 10 mg by mouth daily.   Yes Historical Provider, MD  aspirin EC 81 MG tablet Take 81 mg by mouth daily.   Yes Historical Provider, MD  carvedilol (COREG) 12.5 MG tablet Take 12.5 mg by mouth 2 (two) times daily with a meal.  Yes Historical Provider, MD  donepezil (ARICEPT) 5 MG tablet Take 5 mg by mouth at bedtime.   Yes Historical Provider, MD  furosemide (LASIX) 20 MG tablet Take 20 mg by mouth daily.   Yes Historical Provider, MD  insulin glargine (LANTUS) 100 UNIT/ML injection Inject 40 Units into the skin at bedtime.   Yes Historical Provider, MD  insulin lispro (HUMALOG) 100 UNIT/ML injection Inject 12 Units into the skin 3 (three) times daily with meals.   Yes Historical Provider, MD  mirtazapine (REMERON) 15 MG tablet Take 15 mg by mouth at bedtime.   Yes Historical Provider, MD  rosuvastatin (CRESTOR) 20 MG tablet Take 20 mg by mouth daily.   Yes Historical Provider, MD    REVIEW OF SYSTEMS:  Review of Systems   Constitutional: Negative for fever, chills, weight loss and malaise/fatigue.  HENT: Negative for ear pain, hearing loss and tinnitus.   Eyes: Negative for blurred vision, double vision, pain and redness.  Respiratory: Positive for shortness of breath. Negative for cough and hemoptysis.   Cardiovascular: Positive for chest pain. Negative for palpitations, orthopnea and leg swelling.  Gastrointestinal: Negative for nausea, vomiting, abdominal pain, diarrhea and constipation.  Genitourinary: Negative for dysuria, frequency and hematuria.  Musculoskeletal: Negative for back pain, joint pain and neck pain.  Skin:       No acne, rash, or lesions  Neurological: Negative for dizziness, tremors, focal weakness and weakness.  Endo/Heme/Allergies: Negative for polydipsia. Does not bruise/bleed easily.  Psychiatric/Behavioral: Negative for depression. The patient is not nervous/anxious and does not have insomnia.      VITAL SIGNS:   Filed Vitals:   11/25/14 2324 11/26/14 0139  BP: 172/66 152/62  Pulse: 81 78  Temp: 98.5 F (36.9 C)   TempSrc: Oral   Resp: 23 20  Height:  (1.651 m)   Weight: 79.379 kg (175 lb)   SpO2: 100% 99%   Wt Readings from Last 3 Encounters:  11/25/14 79.379 kg (175 lb)    PHYSICAL EXAMINATION:  Physical Exam  Constitutional: She is oriented to person, place, and time. She appears well-developed and well-nourished. No distress.  HENT:  Head: Normocephalic and atraumatic.  Mouth/Throat: Oropharynx is clear and moist.  Eyes: Conjunctivae and EOM are normal. Pupils are equal, round, and reactive to light. No scleral icterus.  Neck: Normal range of motion. Neck supple. No JVD present. No thyromegaly present.  Cardiovascular: Normal rate, regular rhythm and intact distal pulses.  Exam reveals no gallop and no friction rub.   No murmur heard. Respiratory: Effort normal and breath sounds normal. No respiratory distress. She has no wheezes. She has no rales.  GI:  Soft. Bowel sounds are normal. She exhibits no distension. There is no tenderness.  Musculoskeletal: Normal range of motion. She exhibits no edema.  No arthritis, no gout  Lymphadenopathy:    She has no cervical adenopathy.  Neurological: She is alert and oriented to person, place, and time. No cranial nerve deficit.  No dysarthria, no aphasia  Skin: Skin is warm and dry. No rash noted. No erythema.  Psychiatric: She has a normal mood and affect. Her behavior is normal. Judgment and thought content normal.    LABORATORY PANEL:   CBC  Recent Labs Lab 11/25/14 2321  WBC 3.5*  HGB 9.5*  HCT 29.4*  PLT 159   ------------------------------------------------------------------------------------------------------------------  Chemistries   Recent Labs Lab 11/25/14 2321  NA 139  K 4.4  CL 106  CO2 24  GLUCOSE 357*  BUN 49*  CREATININE 5.48*  CALCIUM 8.3*   ------------------------------------------------------------------------------------------------------------------  Cardiac Enzymes  Recent Labs Lab 11/25/14 2321  TROPONINI 0.05*   ------------------------------------------------------------------------------------------------------------------  RADIOLOGY:  Dg Chest Port 1 View  11/26/2014   CLINICAL DATA:  Left anterior chest pain for 1 day.  EXAM: PORTABLE CHEST - 1 VIEW  COMPARISON:  06/26/2013  FINDINGS: The heart is enlarged, question progressed versus related to differences in technique. There is atherosclerosis of the thoracic aorta. Mild central vascular congestion without overt edema. No large consolidation, pleural effusion or pneumothorax on portable exam.  IMPRESSION: Cardiomegaly with mild vascular congestion.   Electronically Signed   By: Rubye Oaks M.D.   On: 11/26/2014 01:25    EKG:   Orders placed or performed during the hospital encounter of 11/25/14  . ED EKG  . ED EKG    IMPRESSION AND PLAN:  Principal Problem:   Unstable angina -  with mildly positive troponin on first set. Could be troponin leak in the setting of end-stage renal disease, though with prior history of CAD and stent placement will admit to observation, trend troponin, call cardiology consult. 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 When ready for diet, will need to be carb controlled diet, and we'll need to restart her home dose of insulin.   CAD (coronary artery disease), native coronary artery - continue appropriate home meds, cardiology consult as above.   Benign essential HTN - chronic stable problem, continue home meds.   HLD (hyperlipidemia) - chronic stable problem, continue home meds.   All the records are reviewed and case discussed with ED provider. Management plans discussed with the patient and/or family.  DVT PROPHYLAXIS: SubQ heparin  ADMISSION STATUS: Observation  CODE STATUS: Full Advance Directive Documentation        Most Recent Value   Type of Advance Directive  Living will   Pre-existing out of facility DNR order (yellow form or pink MOST form)     "MOST" Form in Place?        TOTAL TIME TAKING CARE OF THIS PATIENT: 45 minutes.    Niyonna Betsill FIELDING 11/26/2014, 1:47 AM  [facility]  Office  [phone]  CC: Primary care physician; Pcp Not In System

## 2014-11-27 LAB — BASIC METABOLIC PANEL
ANION GAP: 6 (ref 5–15)
BUN: 47 mg/dL — ABNORMAL HIGH (ref 6–20)
CO2: 23 mmol/L (ref 22–32)
CREATININE: 5.06 mg/dL — AB (ref 0.44–1.00)
Calcium: 8.5 mg/dL — ABNORMAL LOW (ref 8.9–10.3)
Chloride: 110 mmol/L (ref 101–111)
GFR calc Af Amer: 9 mL/min — ABNORMAL LOW (ref >60)
GFR calc non Af Amer: 8 mL/min — ABNORMAL LOW (ref >60)
Glucose, Bld: 185 mg/dL — ABNORMAL HIGH (ref 65–99)
Potassium: 4.6 mmol/L (ref 3.5–5.1)
Sodium: 139 mmol/L (ref 135–145)

## 2014-11-27 LAB — GLUCOSE, CAPILLARY
GLUCOSE-CAPILLARY: 199 mg/dL — AB (ref 65–99)
GLUCOSE-CAPILLARY: 185 mg/dL — AB (ref 65–99)

## 2014-11-27 MED ORDER — ISOSORBIDE MONONITRATE ER 30 MG PO TB24: 30.0000 mg | ORAL_TABLET | Freq: Every day | ORAL | Status: AC

## 2014-11-30 NOTE — Discharge Summary (Signed)
Loretta Kaiser, is a 69 y.o. female  DOB [birth year]  MRN [id number].  Admission date:  11/25/2014  Admitting Physician  Loretta Manis, MD  Discharge Date:  11/27/2014   Primary MD  Pcp Not In System  Recommendations for primary care physician for things to follow:   Nephrologist, cardiologist.  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 Hypertension   . MI (myocardial infarction)   . Arthritis   . CVA (cerebral infarction)   . Renal artery stenosis   . CKD (chronic kidney disease), stage V   . CAD (coronary artery disease), native coronary artery     Past Surgical History  Procedure Laterality Date  . Coronary angioplasty with stent placement    . Renal artery angioplasty      with stent placement  . Total vaginal hysterectomy         History of present illness and  Hospital Course:     Kindly see H&P for history of present illness and admission details, please review complete Labs, Consult reports and Test reports for all details in brief  HPI  from the history and physical done on the day of admission 69 year old female patient with a history of CAD, stent placement a long time ago comes in because of chest pain. The pain was  pressure-like pain going to the left arm. Dictated to telemetry for unstable angina, cardiology consult was placed placed on aspirin beta blockers and nitrates and statins.  Hospital Course   Non-ST elevation MI patient troponin 7-0.77. Patient remained chest pain-free in the next day. Seen by Dr.FATH from cardiology. Patient did not have any intervention secondary to her advanced renal disease and the fact that she is not on dialysis. Patient has C [name] stage IV and that she is on transplant waiting list at [facility]. The patient BUN/creatinine 47 and 5.06. We have added long-acting nitrates to her home medication regimen. Troponins were negative but EKG did not show any acute ST-T changes. Patient echocardiogram showed EF more than 55%. There is no regional wall motion abnormalities. Patient to home medications include Coreg, Crestor, imdur. 2. Type 2 diabetes mellitus with ESRD. Hypertension. CAD     Discharge Condition: Stable   Follow UP  Follow-up Information    Follow up with Loretta Heading., MD On 12/05/2014.   Specialty:  Cardiology   Why:  at 10:45am   Contact information:   [address] [facility] - CARDIOLOGY [place] [address] [phone]         Discharge Instructions  and  Discharge Medications   Follow up with her nephrologist.     Medication List    TAKE these medications        amLODipine 10 MG tablet  Commonly known as:  NORVASC  Take 10 mg by mouth daily.     aspirin EC 81 MG tablet  Take 81 mg by mouth daily.  carvedilol 12.5 MG tablet  Commonly known as:  COREG  Take 12.5 mg by mouth 2 (two) times daily with a meal.     donepezil 5 MG tablet  Commonly known as:  ARICEPT  Take 5 mg by mouth at bedtime.     furosemide 20 MG tablet  Commonly known as:  LASIX  Take 20 mg by mouth daily.     insulin glargine 100 UNIT/ML injection  Commonly known as:  LANTUS  Inject 40 Units into the skin at bedtime.     insulin lispro 100  UNIT/ML injection  Commonly known as:  HUMALOG  Inject 12 Units into the skin 3 (three) times daily with meals.     isosorbide mononitrate 30 MG 24 hr tablet  Commonly known as:  IMDUR  Take 1 tablet (30 mg total) by mouth daily.     mirtazapine 15 MG tablet  Commonly known as:  REMERON  Take 15 mg by mouth at bedtime.     rosuvastatin 20 MG tablet  Commonly known as:  CRESTOR  Take 20 mg by mouth daily.          Diet and Activity recommendation: See Discharge Instructions above   Consults obtained - allergy   Major procedures and Radiology Reports - PLEASE review detailed and final reports for all details, in brief -     Dg Chest Port 1 View  11/26/2014   CLINICAL DATA:  Left anterior chest pain for 1 day.  EXAM: PORTABLE CHEST - 1 VIEW  COMPARISON:  06/26/2013  FINDINGS: The heart is enlarged, question progressed versus related to differences in technique. There is atherosclerosis of the thoracic aorta. Mild central vascular congestion without overt edema. No large consolidation, pleural effusion or pneumothorax on portable exam.  IMPRESSION: Cardiomegaly with mild vascular congestion.   Electronically Signed   By: Rubye OaksMelanie  Ehinger M.D.   On: 11/26/2014 01:25    Micro Results     No results found for this or any previous visit (from the past 240 hour(s)).     Today   Subjective:   Loretta Kaiser today has no headache,no chest abdominal pain,no new weakness tingling or numbness, feels much better wants to go home today.   Objective:   Blood pressure 145/56, pulse 75, temperature 99.4 F (37.4 C), temperature source Oral, resp. rate 18, height 5\' 5"  (1.651 m), weight 76.839 kg (169 lb 6.4 oz), SpO2 97 %.  No intake or output data in the 24 hours ending 11/30/14 0815  Exam Awake Alert, Oriented x 3, No new F.N deficits, Normal affect [name],PERRAL Supple Neck,No JVD, No cervical lymphadenopathy appriciated.  Symmetrical Chest wall movement, Good air movement  bilaterally, CTAB RRR,No Gallops,Rubs or new Murmurs, No Parasternal Heave +ve B.Sounds, Abd Soft, Non tender, No organomegaly appriciated, No rebound -guarding or rigidity. No Cyanosis, Clubbing or edema, No new Rash or bruise  Data Review   CBC w Diff:  Lab Results  Component Value Date   WBC 4.3 11/26/2014   WBC 5.2 04/02/2012   HGB 9.6* 11/26/2014   HGB 11.0* 04/02/2012   HCT 30.2* 11/26/2014   HCT 33.0* 04/02/2012   PLT 158 11/26/2014   PLT 204 04/02/2012    CMP:  Lab Results  Component Value Date   NA 139 11/27/2014   NA 144 04/02/2012   K 4.6 11/27/2014   K 3.8 04/02/2012   CL 110 11/27/2014   CL 110* 04/02/2012   CO2 23 11/27/2014   CO2 26  04/02/2012   BUN 47* 11/27/2014   BUN 36* 04/02/2012   CREATININE 5.06* 11/27/2014   CREATININE 2.52* 04/02/2012   PROT 7.4 04/02/2012   ALBUMIN 3.3* 04/02/2012   ALKPHOS 119 04/02/2012   AST 21 04/02/2012   ALT 17 04/02/2012  .   Total Time in preparing paper work, data evaluation and todays exam - 35 minutes  Jaxiel Kines M.D on 11/27/2014 at 8:15 AM

## 2015-08-04 ENCOUNTER — Emergency Department
Admission: EM | Admit: 2015-08-04 | Discharge: 2015-08-04 | Disposition: A | Discharge status: Home / Self Care | Payer: Medicare Other | Attending: Student | Admitting: Student

## 2015-08-04 ENCOUNTER — Encounter: Payer: Self-pay | Admitting: Emergency Medicine

## 2015-08-04 DIAGNOSIS — Z79899 Other long term (current) drug therapy: Secondary | ICD-10-CM | POA: Diagnosis not present

## 2015-08-04 DIAGNOSIS — Z794 Long term (current) use of insulin: Secondary | ICD-10-CM | POA: Insufficient documentation

## 2015-08-04 DIAGNOSIS — E11649 Type 2 diabetes mellitus with hypoglycemia without coma: Secondary | ICD-10-CM | POA: Insufficient documentation

## 2015-08-04 DIAGNOSIS — Z87891 Personal history of nicotine dependence: Secondary | ICD-10-CM | POA: Insufficient documentation

## 2015-08-04 DIAGNOSIS — E162 Hypoglycemia, unspecified: Secondary | ICD-10-CM

## 2015-08-04 DIAGNOSIS — E1122 Type 2 diabetes mellitus with diabetic chronic kidney disease: Secondary | ICD-10-CM | POA: Diagnosis not present

## 2015-08-04 DIAGNOSIS — Z7982 Long term (current) use of aspirin: Secondary | ICD-10-CM | POA: Diagnosis not present

## 2015-08-04 DIAGNOSIS — I120 Hypertensive chronic kidney disease with stage 5 chronic kidney disease or end stage renal disease: Secondary | ICD-10-CM | POA: Insufficient documentation

## 2015-08-04 DIAGNOSIS — N390 Urinary tract infection, site not specified: Secondary | ICD-10-CM | POA: Insufficient documentation

## 2015-08-04 DIAGNOSIS — N186 End stage renal disease: Secondary | ICD-10-CM | POA: Diagnosis not present

## 2015-08-04 LAB — CBC WITH DIFFERENTIAL/PLATELET
BASOS ABS: 0.1 10*3/uL (ref 0–0.1)
Basophils Relative: 1 %
EOS ABS: 0.6 10*3/uL (ref 0–0.7)
Eosinophils Relative: 8 %
HCT: 31.6 % — ABNORMAL LOW (ref 35.0–47.0)
HEMOGLOBIN: 10.4 g/dL — AB (ref 12.0–16.0)
Lymphocytes Relative: 17 %
Lymphs Abs: 1.2 10*3/uL (ref 1.0–3.6)
MCH: 29.2 pg (ref 26.0–34.0)
MCHC: 32.9 g/dL (ref 32.0–36.0)
MCV: 88.8 fL (ref 80.0–100.0)
MONOS PCT: 7 %
Monocytes Absolute: 0.5 10*3/uL (ref 0.2–0.9)
NEUTROS PCT: 67 %
Neutro Abs: 4.9 10*3/uL (ref 1.4–6.5)
Platelets: 204 10*3/uL (ref 150–440)
RBC: 3.56 MIL/uL — AB (ref 3.80–5.20)
RDW: 15.3 % — ABNORMAL HIGH (ref 11.5–14.5)
WBC: 7.2 10*3/uL (ref 3.6–11.0)

## 2015-08-04 LAB — COMPREHENSIVE METABOLIC PANEL
ALK PHOS: 199 U/L — AB (ref 38–126)
ALT: 9 U/L — ABNORMAL LOW (ref 14–54)
ANION GAP: 7 (ref 5–15)
AST: 16 U/L (ref 15–41)
Albumin: 3.3 g/dL — ABNORMAL LOW (ref 3.5–5.0)
BUN: 41 mg/dL — ABNORMAL HIGH (ref 6–20)
CALCIUM: 8.8 mg/dL — AB (ref 8.9–10.3)
CO2: 23 mmol/L (ref 22–32)
Chloride: 110 mmol/L (ref 101–111)
Creatinine, Ser: 4.7 mg/dL — ABNORMAL HIGH (ref 0.44–1.00)
GFR calc Af Amer: 10 mL/min — ABNORMAL LOW (ref >60)
GFR calc non Af Amer: 9 mL/min — ABNORMAL LOW (ref >60)
GLUCOSE: 98 mg/dL (ref 65–99)
Potassium: 3.7 mmol/L (ref 3.5–5.1)
SODIUM: 140 mmol/L (ref 135–145)
Total Bilirubin: 0.2 mg/dL — ABNORMAL LOW (ref 0.3–1.2)
Total Protein: 7.1 g/dL (ref 6.5–8.1)

## 2015-08-04 LAB — URINALYSIS COMPLETE WITH MICROSCOPIC (ARMC ONLY)
BILIRUBIN URINE: NEGATIVE
Glucose, UA: NEGATIVE mg/dL
Hgb urine dipstick: NEGATIVE
Ketones, ur: NEGATIVE mg/dL
Nitrite: NEGATIVE
PH: 5 (ref 5.0–8.0)
Specific Gravity, Urine: 1.007 (ref 1.005–1.030)

## 2015-08-04 LAB — GLUCOSE, CAPILLARY
GLUCOSE-CAPILLARY: 72 mg/dL (ref 65–99)
Glucose-Capillary: 113 mg/dL — ABNORMAL HIGH (ref 65–99)
Glucose-Capillary: 161 mg/dL — ABNORMAL HIGH (ref 65–99)

## 2015-08-04 MED ORDER — CEPHALEXIN 500 MG PO CAPS: 500.0000 mg | ORAL_CAPSULE | Freq: Two times a day (BID) | ORAL | Status: DC

## 2015-08-04 MED ORDER — AMLODIPINE BESYLATE 5 MG PO TABS
10.0000 mg | ORAL_TABLET | Freq: Once | ORAL | Status: AC
  Administered 2015-08-04: 10 mg via ORAL
  Filled 2015-08-04: qty 2

## 2015-08-04 MED ORDER — CEPHALEXIN 250 MG PO CAPS
250.0000 mg | ORAL_CAPSULE | Freq: Once | ORAL | Status: AC
  Administered 2015-08-04: 250 mg via ORAL
  Filled 2015-08-04: qty 1

## 2015-08-04 NOTE — ED Notes (Signed)
Pt returned from CT °

## 2015-08-04 NOTE — ED Provider Notes (Signed)
[facility] Emergency Department Provider Note  ____________________________________________  Time seen: Approximately 4:21 PM  I have reviewed the triage vital signs and the nursing notes.   HISTORY  Chief Complaint Hypoglycemia  Caveat-history of present illness and review of systems is limited due to the patient's poor recollection of the days events. Information is obtained from EMS on their arrival.  HPI Loretta Kaiser is a 70 y.o. female with history of coronary artery disease, history of CVA, history of chronic kidney disease, hypertension and diabetes on insulin, no oral anti-hyperglycemics who presents for evaluation of hypoglycemia and altered mental status gradual onset today, now resolved, improved after she got D50 from EMS. According to EMS, son called 911 because the patient was lethargic. On EMS arrival, her blood sugar was in the 30s, she received D50 and her mental status improved as her glucose improved. Currently she feels well and she has no complaints. No chest pain, no difficulty breathing, no vomiting, diarrhea, fevers or chills, she denies any recent illness. She is not sure whether not she took her insulin today or whether not she ate lunch today. She knows for certain that she did not take her antihypertensive medications.   Past Medical History  Diagnosis Date  . 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 Hypertension   . MI (myocardial infarction) (HCC)   . Arthritis   . CVA (cerebral infarction)   . Renal artery stenosis (HCC)   . CKD (chronic kidney disease), stage V (HCC)   . CAD (coronary artery disease), native coronary artery     Patient Active Problem List   Diagnosis Date Noted  . Unstable angina (HCC) 11/26/2014  . 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 CAD (coronary artery disease), native coronary artery 11/26/2014  . Benign essential HTN 11/26/2014  . HLD  (hyperlipidemia) 11/26/2014    Past Surgical History  Procedure Laterality Date  . Coronary angioplasty with stent placement    . Renal artery angioplasty      with stent placement  . Total vaginal hysterectomy      Current Outpatient Rx  Name  Route  Sig  Dispense  Refill  . amLODipine (NORVASC) 10 MG tablet   Oral   Take 10 mg by mouth daily.         Marland Kitchen aspirin EC 81 MG tablet   Oral   Take 81 mg by mouth daily.         . carvedilol (COREG) 12.5 MG tablet   Oral   Take 12.5 mg by mouth 2 (two) times daily with a meal.         . donepezil (ARICEPT) 5 MG tablet   Oral   Take 5 mg by mouth at bedtime.         . furosemide (LASIX) 20 MG tablet   Oral   Take 20 mg by mouth daily.         . insulin glargine (LANTUS) 100 UNIT/ML injection   Subcutaneous   Inject 40 Units into the skin at bedtime.         . insulin lispro (HUMALOG) 100 UNIT/ML injection   Subcutaneous   Inject 12 Units into the skin 3 (three) times daily with meals.         . isosorbide mononitrate (IMDUR) 30 MG 24 hr tablet   Oral   Take 1 tablet (30 mg total) by mouth daily.  30 tablet   0   . mirtazapine (REMERON) 15 MG tablet   Oral   Take 15 mg by mouth at bedtime.         . rosuvastatin (CRESTOR) 20 MG tablet   Oral   Take 20 mg by mouth daily.           Allergies Ace inhibitors; Angiotensin receptor blockers; Lipitor; Sulfa antibiotics; and Simvastatin  Family History  Problem Relation Age of Onset  . Breast cancer Mother   . Thyroid disease Mother   . Hypertension Father   . Heart disease Father   . Diabetes Sister   . Diabetes Brother   . Stroke Brother   . Thyroid disease Paternal Aunt   . Hyperlipidemia    . CAD      Social History Social History  Substance Use Topics  . Smoking status: Former Smoker    Quit date: 06/28/1986  . Smokeless tobacco: None  . Alcohol Use: No    Review of Systems Constitutional: No fever/chills Eyes: No visual  changes. ENT: No sore throat. Cardiovascular: Denies chest pain. Respiratory: Denies shortness of breath. Gastrointestinal: No abdominal pain.  No nausea, no vomiting.  No diarrhea.  No constipation. Genitourinary: Negative for dysuria. Musculoskeletal: Negative for back pain. Skin: Negative for rash. Neurological: Negative for headaches, focal weakness or numbness.  10-point ROS otherwise negative.  ____________________________________________   PHYSICAL EXAM:  VITAL SIGNS: ED Triage Vitals  Enc Vitals Group     BP 08/04/15 1619 209/74 mmHg     Pulse Rate 08/04/15 1619 63     Resp 08/04/15 1619 16     Temp 08/04/15 1619 97.3 F (36.3 C)     Temp Source 08/04/15 1619 Oral     SpO2 08/04/15 1619 97 %     Weight 08/04/15 1619 158 lb (71.668 kg)     Height 08/04/15 1619 5\' 5"  (1.651 m)     Head Cir --      Peak Flow --      Pain Score --      Pain Loc --      Pain Edu? --      Excl. in GC? --     Constitutional: Alert and oriented x 3. Well appearing and in no acute distress. Eyes: Conjunctivae are normal. PERRL. EOMI. Head: Atraumatic. Nose: No congestion/rhinnorhea. Mouth/Throat: Mucous membranes are moist.  Oropharynx non-erythematous. Neck: No stridor. Cardiovascular: Normal rate, regular rhythm. Grossly normal heart sounds.  Good peripheral circulation. Respiratory: Normal respiratory effort.  No retractions. Lungs CTAB. Gastrointestinal: Soft and nontender. No distention.  No CVA tenderness. Genitourinary: deferred Musculoskeletal: No lower extremity tenderness nor edema.  No joint effusions. Neurologic:  Normal speech and language. No gross focal neurologic deficits are appreciated. 5 out of 5 strength in bilateral upper and lower extremity, sensation intact to light touch throughout. Skin:  Skin is warm, dry and intact. No rash noted. Psychiatric: Mood and affect are normal. Speech and behavior are normal.  ____________________________________________    LABS (all labs ordered are listed, but only abnormal results are displayed)  Labs Reviewed  CBC WITH DIFFERENTIAL/PLATELET - Abnormal; Notable for the following:    RBC 3.56 (*)    Hemoglobin 10.4 (*)    HCT 31.6 (*)    RDW 15.3 (*)    All other components within normal limits  COMPREHENSIVE METABOLIC PANEL - Abnormal; Notable for the following:    BUN 41 (*)    Creatinine, Ser 4.70 (*)  Calcium 8.8 (*)    Albumin 3.3 (*)    ALT 9 (*)    Alkaline Phosphatase 199 (*)    Total Bilirubin 0.2 (*)    GFR calc non Af Amer 9 (*)    GFR calc Af Amer 10 (*)    All other components within normal limits  URINALYSIS COMPLETEWITH MICROSCOPIC ([facility] ONLY) - Abnormal; Notable for the following:    Color, Urine YELLOW (*)    APPearance CLOUDY (*)    Protein, ur >500 (*)    Leukocytes, UA 1+ (*)    Bacteria, UA MANY (*)    Squamous Epithelial / LPF 0-5 (*)    All other components within normal limits  GLUCOSE, CAPILLARY - Abnormal; Notable for the following:    Glucose-Capillary 113 (*)    All other components within normal limits  GLUCOSE, CAPILLARY - Abnormal; Notable for the following:    Glucose-Capillary 161 (*)    All other components within normal limits  GLUCOSE, CAPILLARY  CBG MONITORING, ED   ____________________________________________  EKG  ED ECG REPORT I, Gayla Doss, the attending physician, personally viewed and interpreted this ECG.   Date: 08/04/2015  EKG Time: 16:14  Rate: 63  Rhythm: normal sinus rhythm  Axis: normal  Intervals:none  ST&T Change: No acute ST elevation. Q waves in V1, V2 are chronic. EKG is similar to 11/25/2014.  ____________________________________________  RADIOLOGY  none ____________________________________________   PROCEDURES  Procedure(s) performed: None  Critical Care performed: No  ____________________________________________   INITIAL IMPRESSION / ASSESSMENT AND PLAN / ED COURSE  Pertinent labs & imaging  results that were available during my care of the patient were reviewed by me and considered in my medical decision making (see chart for details).  Loretta Kaiser is a 70 y.o. female with history of coronary artery disease, history of CVA, history of chronic kidney disease, hypertension and diabetes on insulin, no oral anti-hyperglycemics who presents for evaluation of hypoglycemia and altered mental status gradual onset today, now resolved, improved after she got D50 from EMS. On exam, she is alert and oriented 3, well-appearing in no acute distress. Hypertensive but I suspect that is secondary to not taking her meds today, will give her her home dose of amlodipine. She has an intact neurological exam. She has no acute medical complaints. Plan for screening labs, EKG, every hour glucose checks. We will feed her. Reassess for disposition.  ----------------------------------------- 9:05 PM on 08/04/2015 -----------------------------------------  Blood sugars stabilized. The patient feels well. She has no acute complaints. Blood pressure improved to 160/62. Labs reviewed are notable for anemia with hemoglobin 10.4 which appears to be her baseline. Creatinine is elevated at 4.70 which also appears to be at or better than her baseline creatinine. Urinanalysis is concerning for urinary tract infection. We'll discharge with Keflex. We discussed decreasing her insulin dose tonight and she will call her doctor in the morning for further instructions. DC with return precautions. She is comfortable with the discharge plan. ____________________________________________   FINAL CLINICAL IMPRESSION(S) / ED DIAGNOSES  Final diagnoses:  Hypoglycemia  UTI (lower urinary tract infection)      Gayla Doss, MD 08/04/15 2106

## 2015-08-04 NOTE — Discharge Instructions (Signed)
Please do not take any NovoLog this evening. Take only 20 units of Lantus tonight and call your doctor in the morning for further instructions regarding your insulin doses. Take the Keflex antibiotic as prescribed. Return immediately to the emergency department if you develop chest pain, difficulty breathing, abdominal pain, vomiting, back pain, diarrhea, fevers, chills or for any other concerns.

## 2015-08-04 NOTE — ED Notes (Signed)
Pt via ems from home; son called because pt was unresponsive; pt blood sugar 34 upon FD arrival; gave D50, CBG 250; 151 during transit; 113 upon assessment in ED. Pt alert & oriented, although confused about whether or not she had eaten or taken her insulin today.

## 2015-10-11 ENCOUNTER — Emergency Department: Payer: Medicare Other

## 2015-10-11 ENCOUNTER — Inpatient Hospital Stay
Admission: EM | Admit: 2015-10-11 | Discharge: 2015-10-14 | DRG: 637 | Disposition: A | Discharge status: Home / Self Care | Payer: Medicare Other | Attending: Internal Medicine | Admitting: Internal Medicine

## 2015-10-11 ENCOUNTER — Encounter: Payer: Self-pay | Admitting: Emergency Medicine

## 2015-10-11 DIAGNOSIS — R680 Hypothermia, not associated with low environmental temperature: Secondary | ICD-10-CM | POA: Diagnosis present

## 2015-10-11 DIAGNOSIS — Z7982 Long term (current) use of aspirin: Secondary | ICD-10-CM

## 2015-10-11 DIAGNOSIS — I252 Old myocardial infarction: Secondary | ICD-10-CM

## 2015-10-11 DIAGNOSIS — Z833 Family history of diabetes mellitus: Secondary | ICD-10-CM

## 2015-10-11 DIAGNOSIS — Z87891 Personal history of nicotine dependence: Secondary | ICD-10-CM

## 2015-10-11 DIAGNOSIS — Z79899 Other long term (current) drug therapy: Secondary | ICD-10-CM

## 2015-10-11 DIAGNOSIS — E11649 Type 2 diabetes mellitus with hypoglycemia without coma: Secondary | ICD-10-CM | POA: Diagnosis not present

## 2015-10-11 DIAGNOSIS — I701 Atherosclerosis of renal artery: Secondary | ICD-10-CM | POA: Diagnosis present

## 2015-10-11 DIAGNOSIS — M1990 Unspecified osteoarthritis, unspecified site: Secondary | ICD-10-CM | POA: Diagnosis present

## 2015-10-11 DIAGNOSIS — E162 Hypoglycemia, unspecified: Secondary | ICD-10-CM | POA: Diagnosis not present

## 2015-10-11 DIAGNOSIS — Z823 Family history of stroke: Secondary | ICD-10-CM

## 2015-10-11 DIAGNOSIS — Z8249 Family history of ischemic heart disease and other diseases of the circulatory system: Secondary | ICD-10-CM

## 2015-10-11 DIAGNOSIS — Z8673 Personal history of transient ischemic attack (TIA), and cerebral infarction without residual deficits: Secondary | ICD-10-CM

## 2015-10-11 DIAGNOSIS — G9340 Encephalopathy, unspecified: Secondary | ICD-10-CM | POA: Diagnosis present

## 2015-10-11 DIAGNOSIS — I2510 Atherosclerotic heart disease of native coronary artery without angina pectoris: Secondary | ICD-10-CM | POA: Diagnosis present

## 2015-10-11 DIAGNOSIS — A419 Sepsis, unspecified organism: Secondary | ICD-10-CM

## 2015-10-11 DIAGNOSIS — Z794 Long term (current) use of insulin: Secondary | ICD-10-CM

## 2015-10-11 DIAGNOSIS — R4182 Altered mental status, unspecified: Secondary | ICD-10-CM

## 2015-10-11 DIAGNOSIS — D631 Anemia in chronic kidney disease: Secondary | ICD-10-CM | POA: Diagnosis present

## 2015-10-11 DIAGNOSIS — Z882 Allergy status to sulfonamides status: Secondary | ICD-10-CM

## 2015-10-11 DIAGNOSIS — Z803 Family history of malignant neoplasm of breast: Secondary | ICD-10-CM

## 2015-10-11 DIAGNOSIS — Z888 Allergy status to other drugs, medicaments and biological substances status: Secondary | ICD-10-CM

## 2015-10-11 DIAGNOSIS — N185 Chronic kidney disease, stage 5: Secondary | ICD-10-CM | POA: Diagnosis present

## 2015-10-11 DIAGNOSIS — E1122 Type 2 diabetes mellitus with diabetic chronic kidney disease: Secondary | ICD-10-CM | POA: Diagnosis present

## 2015-10-11 DIAGNOSIS — Z955 Presence of coronary angioplasty implant and graft: Secondary | ICD-10-CM

## 2015-10-11 DIAGNOSIS — E785 Hyperlipidemia, unspecified: Secondary | ICD-10-CM | POA: Diagnosis present

## 2015-10-11 DIAGNOSIS — I120 Hypertensive chronic kidney disease with stage 5 chronic kidney disease or end stage renal disease: Secondary | ICD-10-CM | POA: Diagnosis present

## 2015-10-11 LAB — CBC WITH DIFFERENTIAL/PLATELET
BASOS PCT: 1 %
Basophils Absolute: 0.1 10*3/uL (ref 0–0.1)
EOS ABS: 0.2 10*3/uL (ref 0–0.7)
Eosinophils Relative: 3 %
HEMATOCRIT: 34.9 % — AB (ref 35.0–47.0)
HEMOGLOBIN: 11.3 g/dL — AB (ref 12.0–16.0)
Lymphocytes Relative: 13 %
Lymphs Abs: 0.9 10*3/uL — ABNORMAL LOW (ref 1.0–3.6)
MCH: 30.4 pg (ref 26.0–34.0)
MCHC: 32.5 g/dL (ref 32.0–36.0)
MCV: 93.5 fL (ref 80.0–100.0)
Monocytes Absolute: 0.2 10*3/uL (ref 0.2–0.9)
Monocytes Relative: 4 %
NEUTROS ABS: 5.4 10*3/uL (ref 1.4–6.5)
NEUTROS PCT: 79 %
Platelets: 202 10*3/uL (ref 150–440)
RBC: 3.73 MIL/uL — AB (ref 3.80–5.20)
RDW: 15.1 % — ABNORMAL HIGH (ref 11.5–14.5)
WBC: 6.8 10*3/uL (ref 3.6–11.0)

## 2015-10-11 LAB — URINALYSIS COMPLETE WITH MICROSCOPIC (ARMC ONLY)
BACTERIA UA: NONE SEEN
Bilirubin Urine: NEGATIVE
KETONES UR: NEGATIVE mg/dL
Leukocytes, UA: NEGATIVE
NITRITE: NEGATIVE
Specific Gravity, Urine: 1.008 (ref 1.005–1.030)
pH: 6 (ref 5.0–8.0)

## 2015-10-11 LAB — TROPONIN I: Troponin I: <0.03 ng/mL (ref <0.031)

## 2015-10-11 LAB — BASIC METABOLIC PANEL
ANION GAP: 5 (ref 5–15)
BUN: 45 mg/dL — ABNORMAL HIGH (ref 6–20)
CHLORIDE: 111 mmol/L (ref 101–111)
CO2: 23 mmol/L (ref 22–32)
Calcium: 8.8 mg/dL — ABNORMAL LOW (ref 8.9–10.3)
Creatinine, Ser: 5.35 mg/dL — ABNORMAL HIGH (ref 0.44–1.00)
GFR calc non Af Amer: 7 mL/min — ABNORMAL LOW (ref >60)
GFR, EST AFRICAN AMERICAN: 8 mL/min — AB (ref >60)
Glucose, Bld: 223 mg/dL — ABNORMAL HIGH (ref 65–99)
POTASSIUM: 3.9 mmol/L (ref 3.5–5.1)
SODIUM: 139 mmol/L (ref 135–145)

## 2015-10-11 NOTE — ED Notes (Signed)
Pt in via EMS; pt found at home per son unresponsive.  Pt with blood glucose of 35 via EMS; 1 glucagon, 1 amp D50 given.  Repeat blood glucose 170 per EMS.  Pt A/Ox3 at this time.  MD at bedside.

## 2015-10-11 NOTE — ED Notes (Signed)
MD made aware of pt temp; pt placed on bair hugger.

## 2015-10-12 ENCOUNTER — Inpatient Hospital Stay
Admission: AD | Admit: 2015-10-12 | Payer: Self-pay | Source: Other Acute Inpatient Hospital | Admitting: Family Medicine

## 2015-10-12 DIAGNOSIS — E11649 Type 2 diabetes mellitus with hypoglycemia without coma: Secondary | ICD-10-CM | POA: Diagnosis present

## 2015-10-12 DIAGNOSIS — I120 Hypertensive chronic kidney disease with stage 5 chronic kidney disease or end stage renal disease: Secondary | ICD-10-CM | POA: Diagnosis present

## 2015-10-12 DIAGNOSIS — E1122 Type 2 diabetes mellitus with diabetic chronic kidney disease: Secondary | ICD-10-CM | POA: Diagnosis present

## 2015-10-12 DIAGNOSIS — G9340 Encephalopathy, unspecified: Secondary | ICD-10-CM | POA: Diagnosis present

## 2015-10-12 DIAGNOSIS — Z803 Family history of malignant neoplasm of breast: Secondary | ICD-10-CM | POA: Diagnosis not present

## 2015-10-12 DIAGNOSIS — M1990 Unspecified osteoarthritis, unspecified site: Secondary | ICD-10-CM | POA: Diagnosis present

## 2015-10-12 DIAGNOSIS — Z8249 Family history of ischemic heart disease and other diseases of the circulatory system: Secondary | ICD-10-CM | POA: Diagnosis not present

## 2015-10-12 DIAGNOSIS — Z833 Family history of diabetes mellitus: Secondary | ICD-10-CM | POA: Diagnosis not present

## 2015-10-12 DIAGNOSIS — E162 Hypoglycemia, unspecified: Secondary | ICD-10-CM | POA: Diagnosis present

## 2015-10-12 DIAGNOSIS — Z955 Presence of coronary angioplasty implant and graft: Secondary | ICD-10-CM | POA: Diagnosis not present

## 2015-10-12 DIAGNOSIS — Z794 Long term (current) use of insulin: Secondary | ICD-10-CM | POA: Diagnosis not present

## 2015-10-12 DIAGNOSIS — Z882 Allergy status to sulfonamides status: Secondary | ICD-10-CM | POA: Diagnosis not present

## 2015-10-12 DIAGNOSIS — Z87891 Personal history of nicotine dependence: Secondary | ICD-10-CM | POA: Diagnosis not present

## 2015-10-12 DIAGNOSIS — Z7982 Long term (current) use of aspirin: Secondary | ICD-10-CM | POA: Diagnosis not present

## 2015-10-12 DIAGNOSIS — I2510 Atherosclerotic heart disease of native coronary artery without angina pectoris: Secondary | ICD-10-CM | POA: Diagnosis present

## 2015-10-12 DIAGNOSIS — Z888 Allergy status to other drugs, medicaments and biological substances status: Secondary | ICD-10-CM | POA: Diagnosis not present

## 2015-10-12 DIAGNOSIS — R680 Hypothermia, not associated with low environmental temperature: Secondary | ICD-10-CM | POA: Diagnosis present

## 2015-10-12 DIAGNOSIS — Z823 Family history of stroke: Secondary | ICD-10-CM | POA: Diagnosis not present

## 2015-10-12 DIAGNOSIS — N185 Chronic kidney disease, stage 5: Secondary | ICD-10-CM | POA: Diagnosis present

## 2015-10-12 DIAGNOSIS — D631 Anemia in chronic kidney disease: Secondary | ICD-10-CM | POA: Diagnosis present

## 2015-10-12 DIAGNOSIS — E785 Hyperlipidemia, unspecified: Secondary | ICD-10-CM | POA: Diagnosis present

## 2015-10-12 DIAGNOSIS — Z8673 Personal history of transient ischemic attack (TIA), and cerebral infarction without residual deficits: Secondary | ICD-10-CM | POA: Diagnosis not present

## 2015-10-12 DIAGNOSIS — I701 Atherosclerosis of renal artery: Secondary | ICD-10-CM | POA: Diagnosis present

## 2015-10-12 DIAGNOSIS — Z79899 Other long term (current) drug therapy: Secondary | ICD-10-CM | POA: Diagnosis not present

## 2015-10-12 DIAGNOSIS — I252 Old myocardial infarction: Secondary | ICD-10-CM | POA: Diagnosis not present

## 2015-10-12 LAB — CREATININE, SERUM
CREATININE: 4.75 mg/dL — AB (ref 0.44–1.00)
GFR calc Af Amer: 10 mL/min — ABNORMAL LOW (ref >60)
GFR, EST NON AFRICAN AMERICAN: 8 mL/min — AB (ref >60)

## 2015-10-12 LAB — LACTIC ACID, PLASMA
Lactic Acid, Venous: 0.9 mmol/L (ref 0.5–2.0)
Lactic Acid, Venous: 0.9 mmol/L (ref 0.5–2.0)

## 2015-10-12 LAB — CBC
HCT: 30.3 % — ABNORMAL LOW (ref 35.0–47.0)
HEMOGLOBIN: 10.1 g/dL — AB (ref 12.0–16.0)
MCH: 30.4 pg (ref 26.0–34.0)
MCHC: 33.4 g/dL (ref 32.0–36.0)
MCV: 91 fL (ref 80.0–100.0)
PLATELETS: 203 10*3/uL (ref 150–440)
RBC: 3.33 MIL/uL — ABNORMAL LOW (ref 3.80–5.20)
RDW: 14.9 % — ABNORMAL HIGH (ref 11.5–14.5)
WBC: 5.3 10*3/uL (ref 3.6–11.0)

## 2015-10-12 LAB — GLUCOSE, CAPILLARY
GLUCOSE-CAPILLARY: 103 mg/dL — AB (ref 65–99)
GLUCOSE-CAPILLARY: 126 mg/dL — AB (ref 65–99)
Glucose-Capillary: 106 mg/dL — ABNORMAL HIGH (ref 65–99)
Glucose-Capillary: 137 mg/dL — ABNORMAL HIGH (ref 65–99)
Glucose-Capillary: 62 mg/dL — ABNORMAL LOW (ref 65–99)

## 2015-10-12 MED ORDER — SODIUM CHLORIDE 0.9% FLUSH
3.0000 mL | Freq: Two times a day (BID) | INTRAVENOUS | Status: DC
  Administered 2015-10-12 – 2015-10-14 (×4): 3 mL via INTRAVENOUS

## 2015-10-12 MED ORDER — ASPIRIN EC 81 MG PO TBEC
81.0000 mg | DELAYED_RELEASE_TABLET | Freq: Every day | ORAL | Status: DC
  Administered 2015-10-12 – 2015-10-14 (×3): 81 mg via ORAL
  Filled 2015-10-12 (×3): qty 1

## 2015-10-12 MED ORDER — INSULIN ASPART 100 UNIT/ML ~~LOC~~ SOLN: 0.0000 [IU] | Freq: Three times a day (TID) | SUBCUTANEOUS | Status: DC

## 2015-10-12 MED ORDER — PIPERACILLIN-TAZOBACTAM 3.375 G IVPB 30 MIN
3.3750 g | Freq: Once | INTRAVENOUS | Status: AC
  Administered 2015-10-12: 3.375 g via INTRAVENOUS
  Filled 2015-10-12: qty 50

## 2015-10-12 MED ORDER — PIPERACILLIN-TAZOBACTAM 3.375 G IVPB
3.3750 g | Freq: Two times a day (BID) | INTRAVENOUS | Status: DC
  Administered 2015-10-12 – 2015-10-13 (×3): 3.375 g via INTRAVENOUS
  Filled 2015-10-12 (×4): qty 50

## 2015-10-12 MED ORDER — CARVEDILOL 12.5 MG PO TABS
12.5000 mg | ORAL_TABLET | Freq: Two times a day (BID) | ORAL | Status: DC
  Administered 2015-10-12 – 2015-10-14 (×4): 12.5 mg via ORAL
  Filled 2015-10-12 (×4): qty 1

## 2015-10-12 MED ORDER — ACETAMINOPHEN 325 MG PO TABS
650.0000 mg | ORAL_TABLET | Freq: Four times a day (QID) | ORAL | Status: DC | PRN
  Administered 2015-10-13 (×2): 650 mg via ORAL
  Filled 2015-10-12 (×2): qty 2

## 2015-10-12 MED ORDER — VANCOMYCIN HCL IN DEXTROSE 1-5 GM/200ML-% IV SOLN
1000.0000 mg | Freq: Once | INTRAVENOUS | Status: AC
  Administered 2015-10-12: 1000 mg via INTRAVENOUS
  Filled 2015-10-12: qty 200

## 2015-10-12 MED ORDER — SODIUM CHLORIDE 0.9% FLUSH: 3.0000 mL | INTRAVENOUS | Status: DC | PRN

## 2015-10-12 MED ORDER — SODIUM CHLORIDE 0.9 % IV BOLUS (SEPSIS)
1000.0000 mL | INTRAVENOUS | Status: AC
  Administered 2015-10-12 (×2): 1000 mL via INTRAVENOUS

## 2015-10-12 MED ORDER — DONEPEZIL HCL 5 MG PO TABS
5.0000 mg | ORAL_TABLET | Freq: Every day | ORAL | Status: DC
  Administered 2015-10-12 – 2015-10-13 (×2): 5 mg via ORAL
  Filled 2015-10-12 (×2): qty 1

## 2015-10-12 MED ORDER — VANCOMYCIN HCL IN DEXTROSE 1-5 GM/200ML-% IV SOLN
1000.0000 mg | INTRAVENOUS | Status: DC
  Administered 2015-10-13: 1000 mg via INTRAVENOUS
  Filled 2015-10-12: qty 200

## 2015-10-12 MED ORDER — ONDANSETRON HCL 4 MG PO TABS: 4.0000 mg | ORAL_TABLET | Freq: Four times a day (QID) | ORAL | Status: DC | PRN

## 2015-10-12 MED ORDER — SODIUM CHLORIDE 0.9 % IV BOLUS (SEPSIS)
500.0000 mL | INTRAVENOUS | Status: AC
  Administered 2015-10-12: 500 mL via INTRAVENOUS

## 2015-10-12 MED ORDER — ACETAMINOPHEN 650 MG RE SUPP
650.0000 mg | Freq: Four times a day (QID) | RECTAL | Status: DC | PRN
Start: 2015-10-12 — End: 2015-10-14

## 2015-10-12 MED ORDER — ONDANSETRON HCL 4 MG/2ML IJ SOLN: 4.0000 mg | Freq: Four times a day (QID) | INTRAMUSCULAR | Status: DC | PRN

## 2015-10-12 MED ORDER — ROSUVASTATIN CALCIUM 20 MG PO TABS
20.0000 mg | ORAL_TABLET | Freq: Every day | ORAL | Status: DC
  Administered 2015-10-12 – 2015-10-13 (×2): 20 mg via ORAL
  Filled 2015-10-12 (×2): qty 1

## 2015-10-12 MED ORDER — SODIUM CHLORIDE 0.9 % IV SOLN
INTRAVENOUS | Status: DC
  Administered 2015-10-12: 13:00:00 via INTRAVENOUS

## 2015-10-12 MED ORDER — AMLODIPINE BESYLATE 10 MG PO TABS
10.0000 mg | ORAL_TABLET | Freq: Every day | ORAL | Status: DC
  Administered 2015-10-12 – 2015-10-14 (×3): 10 mg via ORAL
  Filled 2015-10-12 (×3): qty 1

## 2015-10-12 MED ORDER — HEPARIN SODIUM (PORCINE) 5000 UNIT/ML IJ SOLN
5000.0000 [IU] | Freq: Three times a day (TID) | INTRAMUSCULAR | Status: DC
  Administered 2015-10-12 – 2015-10-14 (×5): 5000 [IU] via SUBCUTANEOUS
  Filled 2015-10-12 (×5): qty 1

## 2015-10-12 MED ORDER — ISOSORBIDE MONONITRATE ER 30 MG PO TB24
30.0000 mg | ORAL_TABLET | Freq: Every day | ORAL | Status: DC
  Administered 2015-10-12 – 2015-10-14 (×3): 30 mg via ORAL
  Filled 2015-10-12 (×3): qty 1

## 2015-10-12 MED ORDER — MIRTAZAPINE 15 MG PO TABS
15.0000 mg | ORAL_TABLET | Freq: Every day | ORAL | Status: DC
  Administered 2015-10-12 – 2015-10-13 (×2): 15 mg via ORAL
  Filled 2015-10-12 (×2): qty 1

## 2015-10-12 NOTE — ED Provider Notes (Signed)
Patient is now normothermic, we will admit to the floor here  Loretta Kaiser Waren, MD 10/12/15 [phone]

## 2015-10-12 NOTE — Plan of Care (Signed)
Problem: Safety: Goal: Ability to remain free from injury will improve Outcome: Not Progressing Pt is impulsive at night, patient becomes disoriented at night time.

## 2015-10-12 NOTE — ED Provider Notes (Signed)
[facility] Emergency Department Provider Note  ____________________________________________  Time seen: Seen upon arrival to the emergency department  I have reviewed the triage vital signs and the nursing notes.   HISTORY  Chief Complaint Hypoglycemia   HPI Loretta Kaiser is a 70 y.o. female with a history of diabetes and chronic kidney disease who is presenting to the emergency department today with hypoglycemia. Per the medics last time she was seen normal by a family member was at about 5 PM which said she felt dizzy and was going to check her blood sugar. Family then found her just prior to arrival unresponsive. EMS found the patient to have a glucose of 35. Was given an amp of D50 and the patient regained consciousness. However, she has been very confused. She knows her name and where she has but she does not know the date or address. Per the family this was highly unusual for the patient.Per EMS, the son was skeptical of the patient had eaten anything all day today. The patient does not remember if she ate something or not or her dosing regimen for her insulin.  Past Medical History  Diagnosis Date  . 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 Hypertension   . MI (myocardial infarction) (HCC)   . Arthritis   . CVA (cerebral infarction)   . Renal artery stenosis (HCC)   . CKD (chronic kidney disease), stage V (HCC)   . CAD (coronary artery disease), native coronary artery     Patient Active Problem List   Diagnosis Date Noted  . Unstable angina (HCC) 11/26/2014  . 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 CAD (coronary artery disease), native coronary artery 11/26/2014  . Benign essential HTN 11/26/2014  . HLD (hyperlipidemia) 11/26/2014    Past Surgical History  Procedure Laterality Date  . Coronary angioplasty with stent placement    . Renal artery angioplasty      with stent placement  .  Total vaginal hysterectomy      Current Outpatient Rx  Name  Route  Sig  Dispense  Refill  . amLODipine (NORVASC) 10 MG tablet   Oral   Take 10 mg by mouth daily.         Marland Kitchen aspirin EC 81 MG tablet   Oral   Take 81 mg by mouth daily.         . carvedilol (COREG) 12.5 MG tablet   Oral   Take 12.5 mg by mouth 2 (two) times daily with a meal.         . cephALEXin (KEFLEX) 500 MG capsule   Oral   Take 1 capsule (500 mg total) by mouth 2 (two) times daily.   14 capsule   0   . donepezil (ARICEPT) 5 MG tablet   Oral   Take 5 mg by mouth at bedtime.         . furosemide (LASIX) 20 MG tablet   Oral   Take 20 mg by mouth daily.         . insulin glargine (LANTUS) 100 UNIT/ML injection   Subcutaneous   Inject 40 Units into the skin at bedtime.         . insulin lispro (HUMALOG) 100 UNIT/ML injection   Subcutaneous   Inject 12 Units into the skin 3 (three) times daily with meals.         . isosorbide mononitrate (IMDUR) 30 MG  24 hr tablet   Oral   Take 1 tablet (30 mg total) by mouth daily.   30 tablet   0   . mirtazapine (REMERON) 15 MG tablet   Oral   Take 15 mg by mouth at bedtime.         . rosuvastatin (CRESTOR) 20 MG tablet   Oral   Take 20 mg by mouth daily.           Allergies Ace inhibitors; Angiotensin receptor blockers; Lipitor; Sulfa antibiotics; and Simvastatin  Family History  Problem Relation Age of Onset  . Breast cancer Mother   . Thyroid disease Mother   . Hypertension Father   . Heart disease Father   . Diabetes Sister   . Diabetes Brother   . Stroke Brother   . Thyroid disease Paternal Aunt   . Hyperlipidemia    . CAD      Social History Social History  Substance Use Topics  . Smoking status: Former Smoker    Quit date: 06/28/1986  . Smokeless tobacco: None  . Alcohol Use: No    Review of Systems Constitutional: No fever/chills Eyes: No visual changes. ENT: No sore throat. Cardiovascular: Denies chest  pain. Respiratory: Denies shortness of breath. Gastrointestinal: No abdominal pain.  No nausea, no vomiting.  No diarrhea.  No constipation. Genitourinary: Negative for dysuria. Musculoskeletal: Negative for back pain. Skin: Negative for rash. Neurological: Negative for headaches, focal weakness or numbness.  10-point ROS otherwise negative.  ____________________________________________   PHYSICAL EXAM:  VITAL SIGNS: ED Triage Vitals  Enc Vitals Group     BP 10/11/15 2152 179/68 mmHg     Pulse Rate 10/11/15 2152 62     Resp 10/11/15 2152 16     Temp 10/11/15 2216 91.7 F (33.2 C)     Temp Source 10/11/15 2216 Rectal     SpO2 10/11/15 2152 99 %     Weight 10/11/15 2152 165 lb 5.5 oz (75 kg)     Height 10/11/15 2152 5\' 6"  (1.676 m)     Head Cir --      Peak Flow --      Pain Score --      Pain Loc --      Pain Edu? --      Excl. in GC? --     Constitutional: Alert and oriented To self and place but not year, date or home address. Well appearing and in no acute distress. Eyes: Conjunctivae are normal. PERRL. EOMI. Head: Atraumatic. Nose: No congestion/rhinnorhea. Mouth/Throat: Mucous membranes are moist.  Oropharynx non-erythematous. Neck: No stridor.   Cardiovascular: Normal rate, regular rhythm. Grossly normal heart sounds.   Respiratory: Normal respiratory effort.  No retractions. Lungs CTAB. Gastrointestinal: Soft and nontender. No distention. No abdominal bruits.  Musculoskeletal: No lower extremity tenderness nor edema.  No joint effusions. Neurologic:  Normal speech and language. No gross focal neurologic deficits are appreciated.  Skin:  Skin is warm, dry and intact. No rash noted. Psychiatric: Mood and affect are normal. Speech and behavior are normal.  ____________________________________________   LABS (all labs ordered are listed, but only abnormal results are displayed)  Labs Reviewed  CBC WITH DIFFERENTIAL/PLATELET - Abnormal; Notable for the  following:    RBC 3.73 (*)    Hemoglobin 11.3 (*)    HCT 34.9 (*)    RDW 15.1 (*)    Lymphs Abs 0.9 (*)    All other components within normal limits  BASIC METABOLIC PANEL -  Abnormal; Notable for the following:    Glucose, Bld 223 (*)    BUN 45 (*)    Creatinine, Ser 5.35 (*)    Calcium 8.8 (*)    GFR calc non Af Amer 7 (*)    GFR calc Af Amer 8 (*)    All other components within normal limits  URINALYSIS COMPLETEWITH MICROSCOPIC ([facility] ONLY) - Abnormal; Notable for the following:    Color, Urine STRAW (*)    APPearance CLEAR (*)    Glucose, UA >500 (*)    Hgb urine dipstick 1+ (*)    Protein, ur >500 (*)    Squamous Epithelial / LPF 0-5 (*)    All other components within normal limits  GLUCOSE, CAPILLARY - Abnormal; Notable for the following:    Glucose-Capillary 137 (*)    All other components within normal limits  CULTURE, BLOOD (ROUTINE X 2)  CULTURE, BLOOD (ROUTINE X 2)  URINE CULTURE  TROPONIN I  LACTIC ACID, PLASMA  LACTIC ACID, PLASMA   ____________________________________________  EKG  ED ECG REPORT I, Schaevitz,  Teena Irani, the attending physician, personally viewed and interpreted this ECG.   Date: 10/12/2015  EKG Time: 2155  Rate: 2155  Rhythm: normal sinus rhythm  Axis: Normal axis  Intervals:Prolonged QT  ST&T Change: T wave inversions in 1 as well as aVL. No ST elevation or depression. T-wave inversion seen on previous EKGs.  ____________________________________________  RADIOLOGY  CT Head Wo Contrast (Final result) Result time: 10/12/15 00:08:12   Final result by Rad Results In Interface (10/12/15 00:08:12)   Narrative:   CLINICAL DATA: Patient found unresponsive, with hypoglycemia. Hypothermia. Initial encounter.  EXAM: CT HEAD WITHOUT CONTRAST  TECHNIQUE: Contiguous axial images were obtained from the base of the skull through the vertex without intravenous contrast.  COMPARISON: None.  FINDINGS: There is no evidence of acute  infarction, mass lesion, or intra- or extra-axial hemorrhage on CT.  Prominence of ventricles and sulci reflects mild cortical volume loss. Mild periventricular and subcortical white matter change likely reflects small vessel ischemic microangiopathy.  The brainstem and fourth ventricle are within normal limits. The basal ganglia are unremarkable in appearance. The cerebral hemispheres demonstrate grossly normal gray-white differentiation. No mass effect or midline shift is seen.  There is no evidence of fracture; visualized osseous structures are unremarkable in appearance. The visualized portions of the orbits are within normal limits. Mucosal thickening is noted at the left maxillary sinus and left side of the sphenoid sinus. The remaining paranasal sinuses and mastoid air cells are well-aerated. No significant soft tissue abnormalities are seen.  IMPRESSION: 1. No acute intracranial pathology seen on CT. 2. Mild cortical volume loss and scattered small vessel ischemic microangiopathy. 3. Mucosal thickening at the left maxillary sinus and left side of the sphenoid sinus.   Electronically Signed By: Roanna Raider M.D. On: 10/12/2015 00:08          DG Chest 2 View (Final result) Result time: 10/11/15 23:24:56   Final result by Rad Results In Interface (10/11/15 23:24:56)   Narrative:   CLINICAL DATA: Altered mental status  EXAM: CHEST 2 VIEW  COMPARISON: 11/25/2014 chest radiograph.  FINDINGS: Stable cardiomediastinal silhouette with mild cardiomegaly. No pneumothorax. No pleural effusion. Cephalization of the pulmonary vasculature without overt pulmonary edema. No acute consolidative airspace disease.  IMPRESSION: Stable mild cardiomegaly without overt pulmonary edema. No active pulmonary disease.   Electronically Signed By: Delbert Phenix M.D. On: 10/11/2015 23:24  ____________________________________________   PROCEDURES   ____________________________________________   INITIAL IMPRESSION / ASSESSMENT AND PLAN / ED COURSE  Pertinent labs & imaging results that were available during my care of the patient were reviewed by me and considered in my medical decision making (see chart for details).  ----------------------------------------- 1:31 AM on 10/12/2015 -----------------------------------------  Patient at this time is maintaining her blood glucose. However, she remains confused. She was able to tell me her address but still does not know what year it is. Furthermore, her core body temperature has remained low despite being on the bear hugger and maintaining her blood sugar at normal level. I'm concerned that she had enough down time and hypoglycemia to cause some brain injury. My other concern is that she may be septic because of her persistently low body temperature. I discussed the case with the patient's son, Mr. Cliffton AstersWhite, who understands the need for revision of the hospital and also need for transfer because of bed capacity issues here in [place]. I discussed with him possible [facility] and he is amenable to both. The patient is also aware of the need for mission in transfer.  I discussed the case with the hospitalist, Dr. Tobi BastosPyreddy, who said that the patient would need at least stepdown because of the body temperature being low. We do not have any stepdown or ICU beds here at [facility]. A call out to [facility] and talked to Dr. Darrick Pennaeterding, of the ICU service he said that the patient would likely be appropriate for stepdown bed. However, there were no available [facility]down beds either at [facility]. They're trying to make stepdown beds available. Care link has paged out to the hospitalist service.  ----------------------------------------- 1:47 AM on 10/12/2015 -----------------------------------------  Patient was [facility] by Dr. Maryfrances Bunnellanford of the medicine service. However, there are no stepdown beds at this time. I will continued to attempt to find a bed for this patient. We'll [facility] to [facility]. Signed out to Dr. Derrill KayGoodman who will follow up with a call [facility]. ____________________________________________   FINAL CLINICAL IMPRESSION(S) / ED DIAGNOSES  Hypoglycemia. Sepsis. Altered mental status.    Myrna Blazeravid Matthew Schaevitz, MD 10/12/15 [phone]

## 2015-10-12 NOTE — Progress Notes (Signed)
Pt. admitted to unit, [id number] from ED, report from [place]. Oriented to room, call bell, Ascom phones and staff. Bed in low position. Fall safety plan reviewed, yellow non-skid socks in place, bed alarm on. Full assessment to Epic; skin assessed with Larose Hiresammy Todd, RN. Telemetry box verified with tele clerk and Sampson Goonhris McCollum, [place]: [phone] . Will continue to monitor.

## 2015-10-12 NOTE — Progress Notes (Deleted)
Patient ID: Loretta RiedelClara J Kaiser, female   DOB: February 26, 1946, 70 y.o.   MRN: [id number] [facility] [place] [facility]   PATIENT NAME: Loretta Kaiser    MR#:  [id number]  DATE OF BIRTH:  February 26, 1946  DATE OF ADMISSION:  10/11/2015  PRIMARY CARE PHYSICIAN: Claretta FraiseSKARIAH,ANITA, DO   REQUESTING/REFERRING PHYSICIAN: Dr. Cyril LoosenKinner  CHIEF COMPLAINT:  Found unresponsive at home.  HISTORY OF PRESENT ILLNESS:  Loretta Kaiser  is a 70 y.o. female with a known history of chronic kidney disease stage V, hypertension, diabetes on insulin, history of coronary artery disease, CVA, comes to the emergency room after she was found unresponsive at home. EMS checked her sugar was 35. She received glucagon and an amp of D50. Currently during my evaluation blood sugar is 223 and patient is awake alert and oriented 2. She was also found to be hypothermic with temperature on arrival to the emergency room 91.2. There however was placed. Temperature currently is back to normal. Patient was empirically received IV Vanco and Zosyn. No specific infection identified so far. Patient ate some [place] sandwich and applesauce and emergency room. She is being admitted for hypothermia which likely is due to hypoglycemia.  PAST MEDICAL HISTORY:   Past Medical History  Diagnosis Date  . 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 Hypertension   . MI (myocardial infarction) (HCC)   . Arthritis   . CVA (cerebral infarction)   . Renal artery stenosis (HCC)   . CKD (chronic kidney disease), stage V (HCC)   . CAD (coronary artery disease), native coronary artery     PAST SURGICAL HISTOIRY:   Past Surgical History  Procedure Laterality Date  . Coronary angioplasty with stent placement    . Renal artery angioplasty      with stent placement  . Total vaginal hysterectomy      SOCIAL HISTORY:   Social History  Substance Use Topics  . Smoking status: Former Smoker    Quit date: 06/28/1986   . Smokeless tobacco: Not on file  . Alcohol Use: No    FAMILY HISTORY:   Family History  Problem Relation Age of Onset  . Breast cancer Mother   . Thyroid disease Mother   . Hypertension Father   . Heart disease Father   . Diabetes Sister   . Diabetes Brother   . Stroke Brother   . Thyroid disease Paternal Aunt   . Hyperlipidemia    . CAD      DRUG ALLERGIES:   Allergies  Allergen Reactions  . Ace Inhibitors     hyperkalemia  . Angiotensin Receptor Blockers     hyperkalemia  . Lipitor [Atorvastatin]     Muscle aches  . Sulfa Antibiotics Hives  . Simvastatin Rash    REVIEW OF SYSTEMS:  Review of Systems  Constitutional: Negative for fever, chills and weight loss.  HENT: Negative for ear discharge, ear pain and nosebleeds.   Eyes: Negative for blurred vision, pain and discharge.  Respiratory: Negative for sputum production, shortness of breath, wheezing and stridor.   Cardiovascular: Negative for chest pain, palpitations, orthopnea and PND.  Gastrointestinal: Negative for nausea, vomiting, abdominal pain and diarrhea.  Genitourinary: Negative for urgency and frequency.  Musculoskeletal: Negative for back pain and joint pain.  Neurological: Positive for loss of consciousness and weakness. Negative for sensory change, speech change and focal weakness.  Psychiatric/Behavioral: Negative for depression and hallucinations. The patient is not nervous/anxious.  All other systems reviewed and are negative.    MEDICATIONS AT HOME:   Prior to Admission medications   Medication Sig Start Date End Date Taking? Authorizing Provider  amLODipine (NORVASC) 10 MG tablet Take 10 mg by mouth daily.   Yes Historical Provider, MD  aspirin EC 81 MG tablet Take 81 mg by mouth daily.   Yes Historical Provider, MD  carvedilol (COREG) 12.5 MG tablet Take 12.5 mg by mouth 2 (two) times daily with a meal.   Yes Historical Provider, MD  donepezil (ARICEPT) 5 MG tablet Take 5 mg by mouth at  bedtime.   Yes Historical Provider, MD  fluticasone (FLONASE) 50 MCG/ACT nasal spray Place 1 spray into both nostrils 2 (two) times daily. 09/27/13  Yes Historical Provider, MD  furosemide (LASIX) 20 MG tablet Take 20 mg by mouth daily.   Yes Historical Provider, MD  insulin glargine (LANTUS) 100 UNIT/ML injection Inject 40 Units into the skin every morning.    Yes Historical Provider, MD  insulin lispro (HUMALOG) 100 UNIT/ML injection Inject 12 Units into the skin 3 (three) times daily with meals.   Yes Historical Provider, MD  isosorbide mononitrate (IMDUR) 30 MG 24 hr tablet Take 1 tablet (30 mg total) by mouth daily. 11/27/14  Yes Katha Hamming, MD  mirtazapine (REMERON) 15 MG tablet Take 15 mg by mouth at bedtime.   Yes Historical Provider, MD  Multiple Vitamin (MULTI-VITAMINS) TABS Take 1 tablet by mouth daily.   Yes Historical Provider, MD  nitroGLYCERIN (NITROSTAT) 0.3 MG SL tablet Place 0.3 mg under the tongue every 5 (five) minutes x 3 doses as needed. For chest pain.Call 911 or MD if not improving after 3 tablets. 12/05/14  Yes Historical Provider, MD  rosuvastatin (CRESTOR) 20 MG tablet Take 20 mg by mouth every morning.    Yes Historical Provider, MD  cephALEXin (KEFLEX) 500 MG capsule Take 1 capsule (500 mg total) by mouth 2 (two) times daily. 08/04/15   Gayla Doss, MD      VITAL SIGNS:  Blood pressure 173/73, pulse 66, temperature 97.5 F (36.4 C), temperature source Rectal, resp. rate 28, height  (1.676 m), weight 75 kg (165 lb 5.5 oz), SpO2 97 %.  PHYSICAL EXAMINATION:  GENERAL:  70 y.o.-year-old patient lying in the bed with no acute distress.  EYES: Pupils equal, round, reactive to light and accommodation. No scleral icterus. Extraocular muscles intact.  HEENT: Head atraumatic, normocephalic. Oropharynx and nasopharynx clear.  NECK:  Supple, no jugular venous distention. No thyroid enlargement, no tenderness.  LUNGS: Normal breath sounds bilaterally, no wheezing,  rales,rhonchi or crepitation. No use of accessory muscles of respiration.  CARDIOVASCULAR: S1, S2 normal. No murmurs, rubs, or gallops.  ABDOMEN: Soft, nontender, nondistended. Bowel sounds present. No organomegaly or mass.  EXTREMITIES: No pedal edema, cyanosis, or clubbing.  NEUROLOGIC: Cranial nerves II through XII are intact. Muscle strength 5/5 in all extremities. Sensation intact. Gait not checked.  PSYCHIATRIC: The patient is alert and oriented x 3.  SKIN: No obvious rash, lesion, or ulcer.   LABORATORY PANEL:   CBC  Recent Labs Lab 10/11/15 2202  WBC 6.8  HGB 11.3*  HCT 34.9*  PLT 202   ------------------------------------------------------------------------------------------------------------------  Chemistries   Recent Labs Lab 10/11/15 2202  NA 139  K 3.9  CL 111  CO2 23  GLUCOSE 223*  BUN 45*  CREATININE 5.35*  CALCIUM 8.8*   ------------------------------------------------------------------------------------------------------------------  Cardiac Enzymes  Recent Labs Lab 10/11/15 2202  TROPONINI <0.03   ------------------------------------------------------------------------------------------------------------------  RADIOLOGY:  Dg Chest 2 View  10/11/2015  CLINICAL DATA:  Altered mental status EXAM: CHEST  2 VIEW COMPARISON:  11/25/2014 chest radiograph. FINDINGS: Stable cardiomediastinal silhouette with mild cardiomegaly. No pneumothorax. No pleural effusion. Cephalization of the pulmonary vasculature without overt pulmonary edema. No acute consolidative airspace disease. IMPRESSION: Stable mild cardiomegaly without overt pulmonary edema. No active pulmonary disease. Electronically Signed   By: Delbert Phenix M.D.   On: 10/11/2015 23:24   Ct Head Wo Contrast  10/12/2015  CLINICAL DATA:  Patient found unresponsive, with hypoglycemia. Hypothermia. Initial encounter. EXAM: CT HEAD WITHOUT CONTRAST TECHNIQUE: Contiguous axial images were obtained from the  base of the skull through the vertex without intravenous contrast. COMPARISON:  None. FINDINGS: There is no evidence of acute infarction, mass lesion, or intra- or extra-axial hemorrhage on CT. Prominence of ventricles and sulci reflects mild cortical volume loss. Mild periventricular and subcortical white matter change likely reflects small vessel ischemic microangiopathy. The brainstem and fourth ventricle are within normal limits. The basal ganglia are unremarkable in appearance. The cerebral hemispheres demonstrate grossly normal gray-white differentiation. No mass effect or midline shift is seen. There is no evidence of fracture; visualized osseous structures are unremarkable in appearance. The visualized portions of the orbits are within normal limits. Mucosal thickening is noted at the left maxillary sinus and left side of the sphenoid sinus. The remaining paranasal sinuses and mastoid air cells are well-aerated. No significant soft tissue abnormalities are seen. IMPRESSION: 1. No acute intracranial pathology seen on CT. 2. Mild cortical volume loss and scattered small vessel ischemic microangiopathy. 3. Mucosal thickening at the left maxillary sinus and left side of the sphenoid sinus. Electronically Signed   By: Roanna Raider M.D.   On: 10/12/2015 00:08    EKG:   Sinus rhythm, prolonged PR interval. IMPRESSION AND PLAN:   Preslynn Bier  is a 70 y.o. female with a known history of chronic kidney disease stage V, hypertension, diabetes on insulin, history of coronary artery disease, CVA, comes to the emergency room after she was found unresponsive at home. EMS checked her sugar was 35. She received glucagon and an amp of D50. Currently during my evaluation blood sugar is 223 and patient is awake alert and oriented 2.  1. acute encephalopathy/unresponsiveness suspected due to hypoglycemia -Patient was found unresponsive at home with blood sugar of 35. She is back to baseline. -She apparently was  not eating well and also took her insulin which bottomed her sugars down given her history of chronic kidney disease -We'll keep her on sliding scale  2. Hypothermia likely due to hypoglycemia however need to rule out infection -Empirically on Zosyn and vancomycin. Will follow blood culture and urine culture. If infection workup is negative discontinue antibiotics in the morning   3. Chronic kidney disease stage V. Patient follows up at [facility] nephrology -Her creatinine is at baseline. We'll consider nephrology consultation  4. Hypertension resume home meds  5. Type 2 diabetes insulin requiring Given hypoglycemia I'll continue sliding scale insulin and resume home dose insulin once patient is able to eat and tolerate diet  6. DVT prophylaxis subcutaneous heparin  All the records are reviewed and case discussed with ED provider. Management plans discussed with the patient, family and they are in agreement.  CODE STATUS: Full  TOTAL TIME TAKING CARE OF THIS PATIENT: 50 minutes.    Shalayna Ornstein M.D on 10/12/2015 at 10:34 AM  Between 7am to 6pm - Pager - [phone]  After 6pm go  to [email or web address] - password [email or web address]  [facility]  Office  [phone]  CC: Primary care physician; Claretta Fraise, DO

## 2015-10-12 NOTE — Consult Note (Signed)
Date: 10/12/2015                  Patient Name:  Loretta Kaiser  MRN: [id number]  DOB: 13-Jan-1946  Age / Sex: 70 y.o., female         PCP: Claretta Fraise, DO                 Service Requesting Consult: Internal medicine                 Reason for Consult: CKD stage V            History of Present Illness: Patient is a 70 y.o. female with medical problems of diabetes type 2, hypertension, coronary disease with recent MI, history of stroke, arthritis, renal artery stenosis, who was admitted to [facility] on 10/11/2015 for evaluation of altered mental status. Patient was found to be hypoglycemic. After collection of blood sugars, she has done well. She is back to her baseline. She is alert and oriented. She denies accidental insulin overdose. She has not been sick. No fevers, chills, cough, sputum, dysuria, diarrhea reported  She is normally followed by [facility] nephrology at [facility].she has had chronic kidney disease for many months. In May 2016, her GFR was 9. This admission, her creatinine is 5.35, GFR is 8. Patient denies nausea, vomiting, dysgeusia. Energy levels have been okay. She works as a Water engineer. She does not have a developing AV fistula PD catheter   Medications: Outpatient medications: Prescriptions prior to admission  Medication Sig Dispense Refill Last Dose  . amLODipine (NORVASC) 10 MG tablet Take 10 mg by mouth daily.   10/11/2015 at Unknown time  . aspirin EC 81 MG tablet Take 81 mg by mouth daily.   10/11/2015 at Unknown time  . carvedilol (COREG) 12.5 MG tablet Take 12.5 mg by mouth 2 (two) times daily with a meal.   10/11/2015 at 0900  . donepezil (ARICEPT) 5 MG tablet Take 5 mg by mouth at bedtime.   10/11/2015 at Unknown time  . fluticasone (FLONASE) 50 MCG/ACT nasal spray Place 1 spray into both nostrils 2 (two) times daily.   10/11/2015 at Unknown time  . furosemide (LASIX) 20 MG tablet Take 20 mg by mouth daily.   10/11/2015 at Unknown time  . insulin glargine  (LANTUS) 100 UNIT/ML injection Inject 40 Units into the skin every morning.    10/11/2015 at Unknown time  . insulin lispro (HUMALOG) 100 UNIT/ML injection Inject 12 Units into the skin 3 (three) times daily with meals.   10/11/2015 at Unknown time  . isosorbide mononitrate (IMDUR) 30 MG 24 hr tablet Take 1 tablet (30 mg total) by mouth daily. 30 tablet 0 10/11/2015 at Unknown time  . mirtazapine (REMERON) 15 MG tablet Take 15 mg by mouth at bedtime.   10/11/2015 at Unknown time  . Multiple Vitamin (MULTI-VITAMINS) TABS Take 1 tablet by mouth daily.   10/11/2015 at Unknown time  . nitroGLYCERIN (NITROSTAT) 0.3 MG SL tablet Place 0.3 mg under the tongue every 5 (five) minutes x 3 doses as needed. For chest pain.Call 911 or MD if not improving after 3 tablets.   unknown  . rosuvastatin (CRESTOR) 20 MG tablet Take 20 mg by mouth every morning.    10/11/2015 at Unknown time  . cephALEXin (KEFLEX) 500 MG capsule Take 1 capsule (500 mg total) by mouth 2 (two) times daily. 14 capsule 0     Current medications: Current Facility-Administered Medications  Medication  Dose Route Frequency Provider Last Rate Last Dose  . 0.9 %  sodium chloride infusion   Intravenous Continuous Enedina Finner, MD 75 mL/hr at 10/12/15 1251    . acetaminophen (TYLENOL) tablet 650 mg  650 mg Oral Q6H PRN Enedina Finner, MD       Or  . acetaminophen (TYLENOL) suppository 650 mg  650 mg Rectal Q6H PRN Enedina Finner, MD      . amLODipine (NORVASC) tablet 10 mg  10 mg Oral Daily Enedina Finner, MD      . aspirin EC tablet 81 mg  81 mg Oral Daily Enedina Finner, MD      . carvedilol (COREG) tablet 12.5 mg  12.5 mg Oral BID WC Enedina Finner, MD      . donepezil (ARICEPT) tablet 5 mg  5 mg Oral QHS Enedina Finner, MD      . heparin injection 5,000 Units  5,000 Units Subcutaneous 3 times per day Enedina Finner, MD      . insulin aspart (novoLOG) injection 0-9 Units  0-9 Units Subcutaneous TID WC Enedina Finner, MD      . isosorbide mononitrate (IMDUR) 24 hr tablet 30 mg  30  mg Oral Daily Enedina Finner, MD      . mirtazapine (REMERON) tablet 15 mg  15 mg Oral QHS Enedina Finner, MD      . ondansetron (ZOFRAN) tablet 4 mg  4 mg Oral Q6H PRN Enedina Finner, MD       Or  . ondansetron (ZOFRAN) injection 4 mg  4 mg Intravenous Q6H PRN Enedina Finner, MD      . piperacillin-tazobactam (ZOSYN) IVPB 3.375 g  3.375 g Intravenous Q12H Myrna Blazer, MD   3.375 g at 10/12/15 1252  . rosuvastatin (CRESTOR) tablet 20 mg  20 mg Oral q1800 Enedina Finner, MD      . Melene Muller ON 10/13/2015] vancomycin (VANCOCIN) IVPB 1000 mg/200 mL premix  1,000 mg Intravenous Q72H Myrna Blazer, MD          Allergies: Allergies  Allergen Reactions  . Ace Inhibitors     hyperkalemia  . Angiotensin Receptor Blockers     hyperkalemia  . Lipitor [Atorvastatin]     Muscle aches  . Sulfa Antibiotics Hives  . Simvastatin Rash      Past Medical History: Past Medical History  Diagnosis Date  . 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 Hypertension   . MI (myocardial infarction) (HCC)   . Arthritis   . CVA (cerebral infarction)   . Renal artery stenosis (HCC)   . CKD (chronic kidney disease), stage V (HCC)   . CAD (coronary artery disease), native coronary artery      Past Surgical History: Past Surgical History  Procedure Laterality Date  . Coronary angioplasty with stent placement    . Renal artery angioplasty      with stent placement  . Total vaginal hysterectomy       Family History: Family History  Problem Relation Age of Onset  . Breast cancer Mother   . Thyroid disease Mother   . Hypertension Father   . Heart disease Father   . Diabetes Sister   . Diabetes Brother   . Stroke Brother   . Thyroid disease Paternal Aunt   . Hyperlipidemia    . CAD       Social History: Social History   Social History  . Marital Status: Divorced    Spouse Name:  N/A  . Number of Children: N/A  . Years of Education: N/A   Occupational History  . Not  on file.   Social History Main Topics  . Smoking status: Former Smoker    Quit date: 06/28/1986  . Smokeless tobacco: Not on file  . Alcohol Use: No  . Drug Use: No  . Sexual Activity: Not on file   Other Topics Concern  . Not on file   Social History Narrative     Review of Systems: Gen: no fevers or chills, no weight loss HEENT: no complaints CV: no recent shortness of breath or lower extremity edema Resp: no cough or sputum ZO:[id number]:appetite is good, no nausea or vomiting or hematemesis GU : no reports of hematuria MS: no acute complaints Derm:  no complaints Psych:no complaints Heme: no complaints Neuro: no complaints Endocrine. no complaints, patient states she has been taking her insulin as prescribed  Vital Signs: Blood pressure 173/65, pulse 76, temperature 98.9 F (37.2 C), temperature source Oral, resp. rate 16, height 5\' 6"  (1.676 m), weight 66.906 kg (147 lb 8 oz), SpO2 100 %.   Intake/Output Summary (Last 24 hours) at 10/12/15 1254 Last data filed at 10/12/15 [id number]  Gross per 24 hour  Intake      0 ml  Output    200 ml  Net   -200 ml    Weight trends: [facility] [employer]   10/11/15 2152 10/12/15 1229  Weight: 75 kg (165 lb 5.5 oz) 66.906 kg (147 lb 8 oz)    Physical Exam: General:  no acute distress, laying in the bed  HEENT Anicteric sclera, moist oral mucous membranes  Neck:  supple  Lungs: Normal effort, room air, clear to auscultation bilaterally  Heart::  regular rate and rhythm, no rub or gallop  Abdomen: Soft, nontender, nondistended  Extremities:  no peripheral edema  Neurologic: Alert, oriented, speech normal  Skin: No acute rashes  Dialysis Access: none          Lab results: Basic Metabolic Panel:  Recent Labs Lab 10/11/15 2202  NA 139  K 3.9  CL 111  CO2 23  GLUCOSE 223*  BUN 45*  CREATININE 5.35*  CALCIUM 8.8*    Liver Function Tests: No results for input(s): AST, ALT, ALKPHOS, BILITOT, PROT, ALBUMIN in the last 168  hours. No results for input(s): LIPASE, AMYLASE in the last 168 hours. No results for input(s): AMMONIA in the last 168 hours.  CBC:  Recent Labs Lab 10/11/15 2202  WBC 6.8  NEUTROABS 5.4  HGB 11.3*  HCT 34.9*  MCV 93.5  PLT 202    Cardiac Enzymes:  Recent Labs Lab 10/11/15 2202  TROPONINI <0.03    BNP: Invalid input(s): POCBNP  CBG:  Recent Labs Lab 10/12/15 0044  GLUCAP 137*    Microbiology: Recent Results (from the past 720 hour(s))  Blood Culture (routine x 2)     Status: None (Preliminary result)   Collection Time: 10/12/15  1:46 AM  Result Value Ref Range Status   Specimen Description BLOOD RIGHT ASSIST CONTROL  Final   Special Requests   Final    BOTTLES DRAWN AEROBIC AND ANAEROBIC 26CCAERO,16CCANA   Culture NO GROWTH < 12 HOURS  Final   Report Status PENDING  Incomplete  Blood Culture (routine x 2)     Status: None (Preliminary result)   Collection Time: 10/12/15  1:50 AM  Result Value Ref Range Status   Specimen Description BLOOD LEFT HAND  Final  Special Requests BOTTLES DRAWN AEROBIC AND ANAEROBIC 3CCAERO,3CCANA  Final   Culture NO GROWTH < 12 HOURS  Final   Report Status PENDING  Incomplete     Coagulation Studies: No results for input(s): LABPROT, INR in the last 72 hours.  Urinalysis:  Recent Labs  10/11/15 2202  COLORURINE STRAW*  LABSPEC 1.008  PHURINE 6.0  GLUCOSEU >500*  HGBUR 1+*  BILIRUBINUR NEGATIVE  KETONESUR NEGATIVE  PROTEINUR >500*  NITRITE NEGATIVE  LEUKOCYTESUR NEGATIVE        Imaging: Dg Chest 2 View  10/11/2015  CLINICAL DATA:  Altered mental status EXAM: CHEST  2 VIEW COMPARISON:  11/25/2014 chest radiograph. FINDINGS: Stable cardiomediastinal silhouette with mild cardiomegaly. No pneumothorax. No pleural effusion. Cephalization of the pulmonary vasculature without overt pulmonary edema. No acute consolidative airspace disease. IMPRESSION: Stable mild cardiomegaly without overt pulmonary edema. No active  pulmonary disease. Electronically Signed   By: Delbert Phenix M.D.   On: 10/11/2015 23:24   Ct Head Wo Contrast  10/12/2015  CLINICAL DATA:  Patient found unresponsive, with hypoglycemia. Hypothermia. Initial encounter. EXAM: CT HEAD WITHOUT CONTRAST TECHNIQUE: Contiguous axial images were obtained from the base of the skull through the vertex without intravenous contrast. COMPARISON:  None. FINDINGS: There is no evidence of acute infarction, mass lesion, or intra- or extra-axial hemorrhage on CT. Prominence of ventricles and sulci reflects mild cortical volume loss. Mild periventricular and subcortical white matter change likely reflects small vessel ischemic microangiopathy. The brainstem and fourth ventricle are within normal limits. The basal ganglia are unremarkable in appearance. The cerebral hemispheres demonstrate grossly normal gray-white differentiation. No mass effect or midline shift is seen. There is no evidence of fracture; visualized osseous structures are unremarkable in appearance. The visualized portions of the orbits are within normal limits. Mucosal thickening is noted at the left maxillary sinus and left side of the sphenoid sinus. The remaining paranasal sinuses and mastoid air cells are well-aerated. No significant soft tissue abnormalities are seen. IMPRESSION: 1. No acute intracranial pathology seen on CT. 2. Mild cortical volume loss and scattered small vessel ischemic microangiopathy. 3. Mucosal thickening at the left maxillary sinus and left side of the sphenoid sinus. Electronically Signed   By: Roanna Raider M.D.   On: 10/12/2015 00:08      Assessment & Plan: Pt is a 70 y.o. yo female with a PMHX of diabetes type 2, hypertension, coronary disease with recent MI, history of stroke, arthritis, renal artery stenosis, was admitted on 10/11/2015 with hypoglycemia induced altered mental status.    1. Chronic kidney disease stage V, followed by [facility] nephrology 2. Diabetes type 2 with  chronic kidney disease, insulin-dependent 3. anemia of chronic kidney disease, most recent hemoglobin 11.3 4. Hypertension  Her serum creatinine/GFR are at baseline. Labs indicate creatinine of 5.35/GFR of 8 Her electrolytes and volume status are acceptable. No acute indication for dialysis Discontinue maintenance IV weights Discussed various dialysis access options including AV fistula, PD catheter. Patient seems to be interested in home peritoneal dialysis. Encouraged patient to talk to her outpatient nephrology team

## 2015-10-12 NOTE — Progress Notes (Signed)
Pharmacy Antibiotic Note  Loretta RiedelClara J Kaiser is a 70 y.o. female admitted on 10/11/2015 with sepsis.  Pharmacy has been consulted for Zosyn and vancomycin dosing. Per ED nurse and patient report, patient is not yet on hemodialysis.   Plan: 1. Zosyn 3.375 gm IV Q12H EI 2. Vancomycin 1 gm IV x 1 in ED followed in 36 hours by vancomycin 1 gm IV Q72H. Predicted trough 15 mcg/mL. Pharmacy will continue to monitor and adjust as needed to maintain trough 15 to 20 mcg/mL.   Vd 45.7 L, Ke 0.0125 hr-1, T1/2 55.3 hr  Height: 5\' 6"  (167.6 cm) Weight: 165 lb 5.5 oz (75 kg) IBW/kg (Calculated) : 59.3  Temp (24hrs), Avg:92.3 F (33.5 C), Min:91.7 F (33.2 C), Max:92.8 F (33.8 C)   Recent Labs Lab 10/11/15 2202  WBC 6.8  CREATININE 5.35*    Estimated Creatinine Clearance: 9.8 mL/min (by C-G formula based on Cr of 5.35).    Allergies  Allergen Reactions  . Ace Inhibitors     hyperkalemia  . Angiotensin Receptor Blockers     hyperkalemia  . Lipitor [Atorvastatin]     Muscle aches  . Sulfa Antibiotics Hives  . Simvastatin Rash    Thank you for allowing pharmacy to be a part of this patient's care.  Carola FrostNathan A Dameion Briles, Pharm.D., BCPS Clinical Pharmacist 10/12/2015 12:54 AM

## 2015-10-12 NOTE — Care Management Important Message (Signed)
Important Message  Patient Details  Name: Loretta RiedelClara J Kaiser MRN: [id number] Date of Birth: October 07, 1945   Medicare Important Message Given:  Yes    Lynora Dymond A, RN 10/12/2015, 3:19 PM

## 2015-10-12 NOTE — Evaluation (Signed)
Physical Therapy Evaluation Patient Details Name: Loretta Kaiser MRN: [id number] DOB: 09-03-1945 Today's Date: 10/12/2015   History of Present Illness  70 y.o. female with a known history of chronic kidney disease stage V, hypertension, diabetes on insulin, history of coronary artery disease, CVA, comes to the emergency room after she was found unresponsive at home. EMS checked her sugar was 35.  Clinical Impression  Pt is able to ambulate 600 ft and negotiate up/down steps largely w/o AD.  She had minimal unsteadiness, but ultimately is safe and should be able to return home w/o issue.  Her O2 remains in the 90s on room air. She does report how she may want a cane, but she was safe w/o it during PT exam today. Pt does not need further PT intervention at this time.     Follow Up Recommendations No PT follow up    Equipment Recommendations       Recommendations for Other Services       Precautions / Restrictions Precautions Precautions: Fall Restrictions Weight Bearing Restrictions: No      Mobility  Bed Mobility Overal bed mobility: Independent                Transfers Overall transfer level: Independent               General transfer comment: Pt is able to rise to standing w/o assist, some initial unsteadiness but ultimately safe with no issues  Ambulation/Gait Ambulation/Gait assistance: Modified independent (Device/Increase time) Ambulation Distance (Feet): 600 Feet Assistive device: None;Rolling walker (2 wheeled)       General Gait Details: Pt walks the first 75 ft with walker, but then does 2 loops w/o AD and though she was slightly slower than normal but with no LOBs, instability and w/o significant fatigue  Stairs Stairs: Yes Stairs assistance: Modified independent (Device/Increase time) Stair Management: One rail Left Number of Stairs: 5 General stair comments: Pt is able to negotiate up/down steps with step-to strategy  Wheelchair Mobility     Modified Rankin (Stroke Patients Only)       Balance Overall balance assessment: Modified Independent                                           Pertinent Vitals/Pain Pain Assessment: No/denies pain    Home Living Family/patient expects to be discharged to:: Private residence Living Arrangements: Alone Available Help at Discharge: Family   Home Access: Stairs to enter Entrance Stairs-Rails: Can reach both Entrance Stairs-Number of Steps: 5          Prior Function Level of Independence: Independent         Comments: Pt able to drive, work and take care of all her errands     Hand Dominance        Extremity/Trunk Assessment   Upper Extremity Assessment: Overall WFL for tasks assessed           Lower Extremity Assessment: Overall WFL for tasks assessed         Communication   Communication: No difficulties  Cognition Arousal/Alertness: Awake/alert Behavior During Therapy: WFL for tasks assessed/performed Overall Cognitive Status: Within Functional Limits for tasks assessed                      General Comments      Exercises  Assessment/Plan    PT Assessment Patent does not need any further PT services  PT Diagnosis Generalized weakness;Difficulty walking   PT Problem List    PT Treatment Interventions     PT Goals (Current goals can be found in the Care Plan section) Acute Rehab PT Goals Patient Stated Goal: go home PT Goal Formulation: With patient Time For Goal Achievement: 10/26/15 Potential to Achieve Goals: Good    Frequency     Barriers to discharge        Co-evaluation               End of Session Equipment Utilized During Treatment: Gait belt Activity Tolerance: Patient tolerated treatment well Patient left: with call bell/phone within reach;with bed alarm set;with family/visitor present           Time: [id number] PT Time Calculation (min) (ACUTE ONLY): 19 min   Charges:    PT Evaluation $PT Eval Low Complexity: 1 Procedure     PT G Codes:       Loran SentersGalen Shakera Ebrahimi, PT, DPT [phone]  Malachi ProGalen R Garv Kuechle 10/12/2015, 5:12 PM

## 2015-10-13 LAB — GLUCOSE, CAPILLARY
GLUCOSE-CAPILLARY: 113 mg/dL — AB (ref 65–99)
Glucose-Capillary: 85 mg/dL (ref 65–99)
Glucose-Capillary: 96 mg/dL (ref 65–99)
Glucose-Capillary: 136 mg/dL — ABNORMAL HIGH (ref 65–99)

## 2015-10-13 LAB — BASIC METABOLIC PANEL
ANION GAP: 3 — AB (ref 5–15)
BUN: 44 mg/dL — ABNORMAL HIGH (ref 6–20)
CALCIUM: 8.1 mg/dL — AB (ref 8.9–10.3)
CO2: 20 mmol/L — AB (ref 22–32)
CREATININE: 4.87 mg/dL — AB (ref 0.44–1.00)
Chloride: 115 mmol/L — ABNORMAL HIGH (ref 101–111)
GFR calc Af Amer: 9 mL/min — ABNORMAL LOW (ref >60)
GFR calc non Af Amer: 8 mL/min — ABNORMAL LOW (ref >60)
GLUCOSE: 93 mg/dL (ref 65–99)
Potassium: 4.1 mmol/L (ref 3.5–5.1)
Sodium: 138 mmol/L (ref 135–145)

## 2015-10-13 LAB — URINE CULTURE: Culture: NO GROWTH

## 2015-10-13 NOTE — Care Management (Signed)
Found unresponsive at home. Blood sugar 35.  Hx: CKD stage V, HTN, IDDM, CVA, followed by [facility]. PCP Dr. Lewis MoccasinSkariah.Continues to work as a LawyerCNA. PT recommending no follow up. Denies issues accessing medications, copays or transportation.

## 2015-10-13 NOTE — Progress Notes (Addendum)
[facility] [place] at [facility]   PATIENT NAME: Loretta Kaiser    MR#:  [id number]  DATE OF BIRTH:  1946/02/28  SUBJECTIVE:  CHIEF COMPLAINT:  Patient is more awake and alert. Answering questions appropriately. Feeling weak and tired. She admits that she took insulin without eating properly. Temperature is much better  REVIEW OF SYSTEMS:  CONSTITUTIONAL: No fever, fatigue or weakness.  EYES: No blurred or double vision.  EARS, NOSE, AND THROAT: No tinnitus or ear pain.  RESPIRATORY: No cough, shortness of breath, wheezing or hemoptysis.  CARDIOVASCULAR: No chest pain, orthopnea, edema.  GASTROINTESTINAL: No nausea, vomiting, diarrhea or abdominal pain.  GENITOURINARY: No dysuria, hematuria.  ENDOCRINE: No polyuria, nocturia,  HEMATOLOGY: No anemia, easy bruising or bleeding SKIN: No rash or lesion. MUSCULOSKELETAL: No joint pain or arthritis.   NEUROLOGIC: No tingling, numbness, weakness.  PSYCHIATRY: No anxiety or depression.   DRUG ALLERGIES:   Allergies  Allergen Reactions  . Ace Inhibitors     hyperkalemia  . Angiotensin Receptor Blockers     hyperkalemia  . Lipitor [Atorvastatin]     Muscle aches  . Sulfa Antibiotics Hives  . Simvastatin Rash    VITALS:  Blood pressure 139/48, pulse 66, temperature 98.6 F (37 C), temperature source Oral, resp. rate 15, height 5\' 6"  (1.676 m), weight 66.906 kg (147 lb 8 oz), SpO2 97 %.  PHYSICAL EXAMINATION:  GENERAL:  70 y.o.-year-old patient lying in the bed with no acute distress.  EYES: Pupils equal, round, reactive to light and accommodation. No scleral icterus. Extraocular muscles intact.  HEENT: Head atraumatic, normocephalic. Oropharynx and nasopharynx clear.  NECK:  Supple, no jugular venous distention. No thyroid enlargement, no tenderness.  LUNGS: Normal breath sounds bilaterally, no wheezing, rales,rhonchi or crepitation. No use of accessory muscles of respiration.  CARDIOVASCULAR: S1, S2  normal. No murmurs, rubs, or gallops.  ABDOMEN: Soft, nontender, nondistended. Bowel sounds present. No organomegaly or mass.  EXTREMITIES: No pedal edema, cyanosis, or clubbing.  NEUROLOGIC: Cranial nerves II through XII are intact. Muscle strength 5/5 in all extremities. Sensation intact. Gait not checked.  PSYCHIATRIC: The patient is alert and oriented x 3.  SKIN: No obvious rash, lesion, or ulcer.    LABORATORY PANEL:   CBC  Recent Labs Lab 10/12/15 1245  WBC 5.3  HGB 10.1*  HCT 30.3*  PLT 203   ------------------------------------------------------------------------------------------------------------------  Chemistries   Recent Labs Lab 10/13/15 0436  NA 138  K 4.1  CL 115*  CO2 20*  GLUCOSE 93  BUN 44*  CREATININE 4.87*  CALCIUM 8.1*   ------------------------------------------------------------------------------------------------------------------  Cardiac Enzymes  Recent Labs Lab 10/11/15 2202  TROPONINI <0.03   ------------------------------------------------------------------------------------------------------------------  RADIOLOGY:  Dg Chest 2 View  10/11/2015  CLINICAL DATA:  Altered mental status EXAM: CHEST  2 VIEW COMPARISON:  11/25/2014 chest radiograph. FINDINGS: Stable cardiomediastinal silhouette with mild cardiomegaly. No pneumothorax. No pleural effusion. Cephalization of the pulmonary vasculature without overt pulmonary edema. No acute consolidative airspace disease. IMPRESSION: Stable mild cardiomegaly without overt pulmonary edema. No active pulmonary disease. Electronically Signed   By: Delbert PhenixJason A Poff M.D.   On: 10/11/2015 23:24   Ct Head Wo Contrast  10/12/2015  CLINICAL DATA:  Patient found unresponsive, with hypoglycemia. Hypothermia. Initial encounter. EXAM: CT HEAD WITHOUT CONTRAST TECHNIQUE: Contiguous axial images were obtained from the base of the skull through the vertex without intravenous contrast. COMPARISON:  None. FINDINGS:  There is no evidence of acute infarction, mass lesion, or intra- or extra-axial  hemorrhage on CT. Prominence of ventricles and sulci reflects mild cortical volume loss. Mild periventricular and subcortical white matter change likely reflects small vessel ischemic microangiopathy. The brainstem and fourth ventricle are within normal limits. The basal ganglia are unremarkable in appearance. The cerebral hemispheres demonstrate grossly normal gray-white differentiation. No mass effect or midline shift is seen. There is no evidence of fracture; visualized osseous structures are unremarkable in appearance. The visualized portions of the orbits are within normal limits. Mucosal thickening is noted at the left maxillary sinus and left side of the sphenoid sinus. The remaining paranasal sinuses and mastoid air cells are well-aerated. No significant soft tissue abnormalities are seen. IMPRESSION: 1. No acute intracranial pathology seen on CT. 2. Mild cortical volume loss and scattered small vessel ischemic microangiopathy. 3. Mucosal thickening at the left maxillary sinus and left side of the sphenoid sinus. Electronically Signed   By: Roanna Raider M.D.   On: 10/12/2015 00:08    EKG:   Orders placed or performed during the hospital encounter of 10/11/15  . ED EKG  . ED EKG  . EKG 12-Lead  . EKG 12-Lead    ASSESSMENT AND PLAN:   Loretta Kaiser is a 70 y.o. female with a known history of chronic kidney disease stage V, hypertension, diabetes on insulin, history of coronary artery disease, CVA, comes to the emergency room after she was found unresponsive at home. EMS checked her sugar was 35. She received glucagon and an amp of D50. Currently during my evaluation blood sugar is 223 and patient is awake alert and oriented 2.  1. acute encephalopathy/unresponsiveness suspected due to hypoglycemia -resolved . Pt took her short-acting and long-acting insulins without eating and was working continuously at could be  the etiology of her hypoglycemia -Patient was found unresponsive at home with blood sugar of 35. She is back to baseline. -on sliding scale,Still Accu-Cheks are at around 70-80  today. Continue close monitoring  2. Hypothermia likely due to hypoglycemia however need to rule out infection -Empirically on Zosyn and vancomycin. Negative blood culture and urine culture in less than 12 hrs. . We will discontinue vancomycin. if infection workup is negative discontinue antibiotics in the morning and d/c pt home  3. Chronic kidney disease stage V. Patient follows up at [facility] nephrology -Her creatinine is at baseline. Appreciate nephrology recommendations and patient prefers following local nephrology group at [facility]  4. Hypertension resume home meds  5. Type 2 diabetes insulin requiring continue sliding scale insulin and resume home dose insulin once patient is able to eat and tolerate diet  6. DVT prophylaxis subcutaneous heparin   Consult physical therapy for deconditioning  All the records are reviewed and case discussed with Care Management/Social Workerr. Management plans discussed with the patient, she is  in agreement.  CODE STATUS: fc  TOTAL TIME TAKING CARE OF THIS PATIENT: 35  minutes.   POSSIBLE D/C IN am  DAYS, DEPENDING ON CLINICAL CONDITION.   Ramonita Lab M.D on 10/13/2015 at 2:48 PM  Between 7am to 6pm - Pager - [phone] After 6pm go to [email or web address] - password [email or web address] [facility]  [facility]  Office  [phone]  CC: Primary care physician; Claretta Fraise, DO

## 2015-10-13 NOTE — Progress Notes (Signed)
[facility]  ROUNDING NOTE   Subjective:   Sitting in bed. No complaints.   Objective:  Vital signs in last 24 hours:  Temp:  [96.4 F (35.8 C)-98.9 F (37.2 C)] 98.6 F (37 C) (04/17 0737) Pulse Rate:  [60-76] 69 (04/17 0737) Resp:  [16-18] 18 (04/17 0433) BP: (133-177)/(51-65) 177/64 mmHg (04/17 0737) SpO2:  [95 %-100 %] 95 % (04/17 0737) Weight:  [66.906 kg (147 lb 8 oz)] 66.906 kg (147 lb 8 oz) (04/16 1229)  Weight change: -8.094 kg (-17 lb 13.5 oz) Filed Weights   10/11/15 2152 10/12/15 1229  Weight: 75 kg (165 lb 5.5 oz) 66.906 kg (147 lb 8 oz)    Intake/Output: I/O last 3 completed shifts: In: 426.3 [P.O.:240; I.V.:86.3; IV Piggyback:100] Out: 570 [Urine:570]   Intake/Output this shift:  Total I/O In: 240 [P.O.:240] Out: -   Physical Exam: General: NAD,   Head: Normocephalic, atraumatic. Moist oral mucosal membranes  Eyes: Anicteric, PERRL  Neck: Supple, trachea midline  Lungs:  Clear to auscultation  Heart: Regular rate and rhythm  Abdomen:  Soft, nontender,   Extremities: no peripheral edema.  Neurologic: Nonfocal, moving all four extremities  Skin: No lesions  Access: none    Basic Metabolic Panel:  Recent Labs Lab 10/11/15 2202 10/12/15 1245 10/13/15 0436  NA 139  --  138  K 3.9  --  4.1  CL 111  --  115*  CO2 23  --  20*  GLUCOSE 223*  --  93  BUN 45*  --  44*  CREATININE 5.35* 4.75* 4.87*  CALCIUM 8.8*  --  8.1*    Liver Function Tests: No results for input(s): AST, ALT, ALKPHOS, BILITOT, PROT, ALBUMIN in the last 168 hours. No results for input(s): LIPASE, AMYLASE in the last 168 hours. No results for input(s): AMMONIA in the last 168 hours.  CBC:  Recent Labs Lab 10/11/15 2202 10/12/15 1245  WBC 6.8 5.3  NEUTROABS 5.4  --   HGB 11.3* 10.1*  HCT 34.9* 30.3*  MCV 93.5 91.0  PLT 202 203    Cardiac Enzymes:  Recent Labs Lab 10/11/15 2202  TROPONINI <0.03    BNP: Invalid input(s):  POCBNP  CBG:  Recent Labs Lab 10/12/15 1250 10/12/15 1336 10/12/15 1633 10/12/15 2112 10/13/15 0718  GLUCAP 62* 103* 106* 126* 85    Microbiology: Results for orders placed or performed during the hospital encounter of 10/11/15  Blood Culture (routine x 2)     Status: None (Preliminary result)   Collection Time: 10/12/15  1:46 AM  Result Value Ref Range Status   Specimen Description BLOOD RIGHT ASSIST CONTROL  Final   Special Requests   Final    BOTTLES DRAWN AEROBIC AND ANAEROBIC 26CCAERO,16CCANA   Culture NO GROWTH < 12 HOURS  Final   Report Status PENDING  Incomplete  Blood Culture (routine x 2)     Status: None (Preliminary result)   Collection Time: 10/12/15  1:50 AM  Result Value Ref Range Status   Specimen Description BLOOD LEFT HAND  Final   Special Requests BOTTLES DRAWN AEROBIC AND ANAEROBIC 3CCAERO,3CCANA  Final   Culture NO GROWTH < 12 HOURS  Final   Report Status PENDING  Incomplete  Urine culture     Status: None   Collection Time: 10/12/15  4:29 AM  Result Value Ref Range Status   Specimen Description URINE, RANDOM  Final   Special Requests NONE  Final   Culture NO GROWTH 1 DAY  Final   Report Status 10/13/2015 FINAL  Final    Coagulation Studies: No results for input(s): LABPROT, INR in the last 72 hours.  Urinalysis:  Recent Labs  10/11/15 2202  COLORURINE STRAW*  LABSPEC 1.008  PHURINE 6.0  GLUCOSEU >500*  HGBUR 1+*  BILIRUBINUR NEGATIVE  KETONESUR NEGATIVE  PROTEINUR >500*  NITRITE NEGATIVE  LEUKOCYTESUR NEGATIVE      Imaging: Dg Chest 2 View  10/11/2015  CLINICAL DATA:  Altered mental status EXAM: CHEST  2 VIEW COMPARISON:  11/25/2014 chest radiograph. FINDINGS: Stable cardiomediastinal silhouette with mild cardiomegaly. No pneumothorax. No pleural effusion. Cephalization of the pulmonary vasculature without overt pulmonary edema. No acute consolidative airspace disease. IMPRESSION: Stable mild cardiomegaly without overt pulmonary  edema. No active pulmonary disease. Electronically Signed   By: Delbert Phenix M.D.   On: 10/11/2015 23:24   Ct Head Wo Contrast  10/12/2015  CLINICAL DATA:  Patient found unresponsive, with hypoglycemia. Hypothermia. Initial encounter. EXAM: CT HEAD WITHOUT CONTRAST TECHNIQUE: Contiguous axial images were obtained from the base of the skull through the vertex without intravenous contrast. COMPARISON:  None. FINDINGS: There is no evidence of acute infarction, mass lesion, or intra- or extra-axial hemorrhage on CT. Prominence of ventricles and sulci reflects mild cortical volume loss. Mild periventricular and subcortical white matter change likely reflects small vessel ischemic microangiopathy. The brainstem and fourth ventricle are within normal limits. The basal ganglia are unremarkable in appearance. The cerebral hemispheres demonstrate grossly normal gray-white differentiation. No mass effect or midline shift is seen. There is no evidence of fracture; visualized osseous structures are unremarkable in appearance. The visualized portions of the orbits are within normal limits. Mucosal thickening is noted at the left maxillary sinus and left side of the sphenoid sinus. The remaining paranasal sinuses and mastoid air cells are well-aerated. No significant soft tissue abnormalities are seen. IMPRESSION: 1. No acute intracranial pathology seen on CT. 2. Mild cortical volume loss and scattered small vessel ischemic microangiopathy. 3. Mucosal thickening at the left maxillary sinus and left side of the sphenoid sinus. Electronically Signed   By: Roanna Raider M.D.   On: 10/12/2015 00:08     Medications:     . amLODipine  10 mg Oral Daily  . aspirin EC  81 mg Oral Daily  . carvedilol  12.5 mg Oral BID WC  . donepezil  5 mg Oral QHS  . heparin  5,000 Units Subcutaneous 3 times per day  . insulin aspart  0-9 Units Subcutaneous TID WC  . isosorbide mononitrate  30 mg Oral Daily  . mirtazapine  15 mg Oral QHS   . piperacillin-tazobactam (ZOSYN)  IV  3.375 g Intravenous Q12H  . rosuvastatin  20 mg Oral q1800  . sodium chloride flush  3 mL Intravenous Q12H  . vancomycin  1,000 mg Intravenous Q72H   acetaminophen **OR** acetaminophen, ondansetron **OR** ondansetron (ZOFRAN) IV, sodium chloride flush  Assessment/ Plan:  Ms. Loretta Kaiser is a 70 y.o.  black  female with diabetes type 2, hypertension, coronary disease with recent MI, history of stroke, arthritis, renal artery stenosis, was admitted on 10/11/2015 with hypoglycemia induced altered mental status.   1. Chronic kidney disease stage V, followed by [facility] nephrology: not yet on dialysis. Secondary to diabetic nephropathy - follow up with outpatient nephrologist. Creatinine at baseline.   2. Diabetes type 2 with chronic kidney disease, insulin-dependent - continue glucose control.  3. Anemia of chronic kidney disease - no indication for epo.  4. Hypertension: elevated.  - amlodipine, carvedilol, imdur   LOS: 1 Anuja Manka 4/17/201711:26 AM

## 2015-10-13 NOTE — Progress Notes (Signed)
Initial Nutrition Assessment  DOCUMENTATION CODES:   Not applicable  INTERVENTION:  -Cater to pt preferences -Pt may benefit from snacks between meals; pt will receive automatic bedtime snack secondary to current diet order -If po intake inadequate on follow-up, recommend addition of nutritional supplement   NUTRITION DIAGNOSIS:   Inadequate oral intake related to poor appetite as evidenced by per patient/family report.  GOAL:   Patient will meet greater than or equal to 90% of their needs  MONITOR:   PO intake, Labs, Weight trends  REASON FOR ASSESSMENT:   Malnutrition Screening Tool    ASSESSMENT:   70 yo female admitted with acute encephalopathy due to hypoglycemia after being found unresponsive at home, hypothermic; pt with hx of CKD V, not yet on dialysis  Past Medical History  Diagnosis Date  . 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 Hypertension   . MI (myocardial infarction) (HCC)   . Arthritis   . CVA (cerebral infarction)   . Renal artery stenosis (HCC)   . CKD (chronic kidney disease), stage V (HCC)   . CAD (coronary artery disease), native coronary artery      Diet Order:  Diet renal/carb modified with fluid restriction Diet-HS Snack?: Nothing; Room service appropriate?: Yes; Fluid consistency:: Thin   Energy Intake: recorded po intake 75% of meals on average  Food and nutrition related history: pt reports appetite has been down for the last few weeks, not eating as well as she normally would. Not been taking nutritional supplements  Skin:  Reviewed, no issues  Last BM:  10/11/15   Nutrition-Focused physical exam completed. Findings are WDL for fat depletion, muscle depletion, and edema.   Labs:   Glucose Profile:   Recent Labs  10/12/15 2112 10/13/15 0718 10/13/15 1134  GLUCAP 126* 85 96   Meds: ss novolog, remeron  Height:   Ht Readings from Last 1 Encounters:  10/12/15 5\' 6"  (1.676 m)    Weight: pt  reports UBW around 160 pounds but is not sure when she last weighed this; pt reports she has lost weight but does not know how much. Per weight encounters, 13% wt loss since June of last year; 7% wt loss in >2 months.   Wt Readings from Last 1 Encounters:  10/12/15 147 lb 8 oz (66.906 kg)   Wt Readings from Last 10 Encounters:  10/12/15 147 lb 8 oz (66.906 kg)  08/04/15 158 lb (71.668 kg)  11/27/14 169 lb 6.4 oz (76.839 kg)    BMI:  Body mass index is 23.82 kg/(m^2).  Estimated Nutritional Needs:   Kcal:  1675-2010 kcals   Protein:  67-80 g   Fluid:  >1.7 L  EDUCATION NEEDS:   Education needs addressed  Romelle Starcherate Alexa Golebiewski MS, RD, LDN [phone] Pager  [phone] Weekend/On-Call Pager

## 2015-10-14 LAB — GLUCOSE, CAPILLARY: Glucose-Capillary: 103 mg/dL — ABNORMAL HIGH (ref 65–99)

## 2015-10-14 MED ORDER — FLUTICASONE PROPIONATE 50 MCG/ACT NA SUSP
1.0000 | Freq: Two times a day (BID) | NASAL | Status: DC
  Administered 2015-10-14: 1 via NASAL
  Filled 2015-10-14: qty 16

## 2015-10-14 MED ORDER — NITROGLYCERIN 0.4 MG SL SUBL
0.4000 mg | SUBLINGUAL_TABLET | SUBLINGUAL | Status: DC | PRN
Start: 2015-10-14 — End: 2015-10-14

## 2015-10-14 MED ORDER — ADULT MULTIVITAMIN W/MINERALS CH
1.0000 | ORAL_TABLET | Freq: Every day | ORAL | Status: DC
  Administered 2015-10-14: 1 via ORAL
  Filled 2015-10-14: qty 1

## 2015-10-14 MED ORDER — FUROSEMIDE 20 MG PO TABS: 20.0000 mg | ORAL_TABLET | ORAL | Status: DC

## 2015-10-14 NOTE — Discharge Summary (Signed)
[facility] [place] [facility]   PATIENT NAME: Loretta Kaiser    MR#:  [id number]  DATE OF BIRTH:  Dec 23, 1945  DATE OF ADMISSION:  10/11/2015 ADMITTING PHYSICIAN: Enedina FinnerSona Syris Brookens, MD  DATE OF DISCHARGE: 10/14/15  PRIMARY CARE PHYSICIAN: SKARIAH,ANITA, DO    ADMISSION DIAGNOSIS:  Hypoglycemia [E16.2] Sepsis, due to unspecified organism (HCC) [A41.9] Altered mental status, unspecified altered mental status type [R41.82]  DISCHARGE DIAGNOSIS:  Hypoglycemia CKD-V HTN DM-2 now off Insulin  SECONDARY DIAGNOSIS:   Past Medical History  Diagnosis Date  . 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 Hypertension   . MI (myocardial infarction) (HCC)   . Arthritis   . CVA (cerebral infarction)   . Renal artery stenosis (HCC)   . CKD (chronic kidney disease), stage V (HCC)   . CAD (coronary artery disease), native coronary artery     HOSPITAL COURSE:  Loretta MacClara Mcnabb is a 70 y.o. female with a known history of chronic kidney disease stage V, hypertension, diabetes on insulin, history of coronary artery disease, CVA, comes to the emergency room after she was found unresponsive at home. EMS checked her sugar was 35. She received glucagon and an amp of D50. Currently during my evaluation blood sugar is 223 and patient is awake alert and oriented 2.  1. acute encephalopathy/unresponsiveness suspected due to hypoglycemia -resolved . Pt took her short-acting and long-acting insulins without eating and was working continuously at could be the etiology of her hypoglycemia -Patient was found unresponsive at home with blood sugar of 35. She is back to baseline. -on sliding scale,Still Accu-Cheks are at around 70-80 today. Continue close monitoring -pt advised NOT to take anymore insulin. Keep log of sugars at home for PCP to follow. Left above msg for son Dexter  2. Hypothermia likely due to hypoglycemia however need to rule out  infection -Empirically on Zosyn and vancomycin. Negative blood culture and urine culture  -d/c abxs  3. Chronic kidney disease stage V. [facility] -Her creatinine is at baseline. Appreciate nephrology recommendations and patient prefers [facility]  4. Hypertension resume home meds  5. Type 2 diabetes insulin requiring Sugars stable. No insulin at d/c  6. DVT prophylaxis subcutaneous heparin  PT recommends no needs at home  D/c home  CONSULTS OBTAINED:  Treatment Team:  Mosetta PigeonHarmeet Singh, MD  DRUG ALLERGIES:   Allergies  Allergen Reactions  . Ace Inhibitors     hyperkalemia  . Angiotensin Receptor Blockers     hyperkalemia  . Lipitor [Atorvastatin]     Muscle aches  . Sulfa Antibiotics Hives  . Simvastatin Rash    DISCHARGE MEDICATIONS:   Current Discharge Medication List    CONTINUE these medications which have CHANGED   Details  furosemide (LASIX) 20 MG tablet Take 1 tablet (20 mg total) by mouth every other day. Qty: 30 tablet, Refills: 2      CONTINUE these medications which have NOT CHANGED   Details  amLODipine (NORVASC) 10 MG tablet Take 10 mg by mouth daily.    aspirin EC 81 MG tablet Take 81 mg by mouth daily.    carvedilol (COREG) 12.5 MG tablet Take 12.5 mg by mouth 2 (two) times daily with a meal.    donepezil (ARICEPT) 5 MG tablet Take 5 mg by mouth at bedtime.    fluticasone (FLONASE) 50 MCG/ACT nasal spray Place 1 spray into both nostrils 2 (two)  times daily.    isosorbide mononitrate (IMDUR) 30 MG 24 hr tablet Take 1 tablet (30 mg total) by mouth daily. Qty: 30 tablet, Refills: 0    mirtazapine (REMERON) 15 MG tablet Take 15 mg by mouth at bedtime.    Multiple Vitamin (MULTI-VITAMINS) TABS Take 1 tablet by mouth daily.    nitroGLYCERIN (NITROSTAT) 0.3 MG SL tablet Place 0.3 mg under the tongue every 5 (five) minutes x 3 doses as needed. For chest pain.Call 911 or MD if not improving  after 3 tablets.    rosuvastatin (CRESTOR) 20 MG tablet Take 20 mg by mouth every morning.       STOP taking these medications     insulin glargine (LANTUS) 100 UNIT/ML injection      insulin lispro (HUMALOG) 100 UNIT/ML injection      cephALEXin (KEFLEX) 500 MG capsule         If you experience worsening of your admission symptoms, develop shortness of breath, life threatening emergency, suicidal or homicidal thoughts you must seek medical attention immediately by calling 911 or calling your MD immediately  if symptoms less severe.  You Must read complete instructions/literature along with all the possible adverse reactions/side effects for all the Medicines you take and that have been prescribed to you. Take any new Medicines after you have completely understood and accept all the possible adverse reactions/side effects.   Please note  You were cared for by a hospitalist during your hospital stay. If you have any questions about your discharge medications or the care you received while you were in the hospital after you are discharged, you can call the unit and asked to speak with the hospitalist on call if the hospitalist that took care of you is not available. Once you are discharged, your primary care physician will handle any further medical issues. Please note that NO REFILLS for any discharge medications will be authorized once you are discharged, as it is imperative that you return to your primary care physician (or establish a relationship with a primary care physician if you do not have one) for your aftercare needs so that they can reassess your need for medications and monitor your lab values. Today   SUBJECTIVE   Doing well  VITAL SIGNS:  Blood pressure 177/66, pulse 67, temperature 98.5 F (36.9 C), temperature source Oral, resp. rate 18, height  (1.676 m), weight 66.906 kg (147 lb 8 oz), SpO2 95 %.  I/O:   Intake/Output Summary (Last 24 hours) at 10/14/15  0721 Last data filed at 10/14/15 0500  Gross per 24 hour  Intake    720 ml  Output    650 ml  Net     70 ml    PHYSICAL EXAMINATION:  GENERAL:  71 y.o.-year-old patient lying in the bed with no acute distress.  EYES: Pupils equal, round, reactive to light and accommodation. No scleral icterus. Extraocular muscles intact.  HEENT: Head atraumatic, normocephalic. Oropharynx and nasopharynx clear.  NECK:  Supple, no jugular venous distention. No thyroid enlargement, no tenderness.  LUNGS: Normal breath sounds bilaterally, no wheezing, rales,rhonchi or crepitation. No use of accessory muscles of respiration.  CARDIOVASCULAR: S1, S2 normal. No murmurs, rubs, or gallops.  ABDOMEN: Soft, non-tender, non-distended. Bowel sounds present. No organomegaly or mass.  EXTREMITIES: No pedal edema, cyanosis, or clubbing.  NEUROLOGIC: Cranial nerves II through XII are intact. Muscle strength 5/5 in all extremities. Sensation intact. Gait not checked.  PSYCHIATRIC: The patient is alert and oriented  x 3.  SKIN: No obvious rash, lesion, or ulcer.   DATA REVIEW:   CBC   Recent Labs Lab 10/12/15 1245  WBC 5.3  HGB 10.1*  HCT 30.3*  PLT 203    Chemistries   Recent Labs Lab 10/13/15 0436  NA 138  K 4.1  CL 115*  CO2 20*  GLUCOSE 93  BUN 44*  CREATININE 4.87*  CALCIUM 8.1*    Microbiology Results   Recent Results (from the past 240 hour(s))  Blood Culture (routine x 2)     Status: None (Preliminary result)   Collection Time: 10/12/15  1:46 AM  Result Value Ref Range Status   Specimen Description BLOOD RIGHT ASSIST CONTROL  Final   Special Requests   Final    BOTTLES DRAWN AEROBIC AND ANAEROBIC 26CCAERO,16CCANA   Culture NO GROWTH 1 DAY  Final   Report Status PENDING  Incomplete  Blood Culture (routine x 2)     Status: None (Preliminary result)   Collection Time: 10/12/15  1:50 AM  Result Value Ref Range Status   Specimen Description BLOOD LEFT HAND  Final   Special Requests  BOTTLES DRAWN AEROBIC AND ANAEROBIC 3CCAERO,3CCANA  Final   Culture NO GROWTH 1 DAY  Final   Report Status PENDING  Incomplete  Urine culture     Status: None   Collection Time: 10/12/15  4:29 AM  Result Value Ref Range Status   Specimen Description URINE, RANDOM  Final   Special Requests NONE  Final   Culture NO GROWTH 1 DAY  Final   Report Status 10/13/2015 FINAL  Final    RADIOLOGY:  No results found.   Management plans discussed with the patient, family and they are in agreement.  CODE STATUS:     Code Status Orders        Start     Ordered   10/12/15 1216  Full code   Continuous     10/12/15 1215    Code Status History    Date Active Date Inactive Code Status Order ID Comments User Context   11/26/2014  2:58 AM 11/27/2014  2:53 PM Full Code [id number]  Oralia Manis, MD Inpatient      TOTAL TIME TAKING CARE OF THIS PATIENT: 40 minutes.    Madelein Mahadeo M.D on 10/14/2015 at 7:21 AM  Between 7am to 6pm - Pager - [phone] After 6pm go to [email or web address] - password [email or web address]  [facility]  Office  [phone]  CC: Primary care physician; Claretta Fraise, DO

## 2015-10-14 NOTE — Progress Notes (Signed)
Patient discharged via wheelchair and private vehicle. IV removed and catheter intact. All discharge instructions given and patient verbalizes understanding. Patient educated to check her blood sugar breakfast, lunch, dinner and bedtime and given a log to record her blood sugars for primary care dr visit. Patient educated on the importance of eating and that she is to not take her insulin. Patient educated that she is to take her furosemide every other day. Verbalizes understand. Cousin at bedside for teaching and agreed to help patient remember this. Tele removed and returned. prescriptions sent to patient pharmacy of choice and understands that she is to go there and pick up medication. No distress noted at time of discharge. Volunteers called to bring patient downstairs.

## 2015-10-14 NOTE — Care Management Important Message (Signed)
Important Message  Patient Details  Name: Anette RiedelClara J Carl MRN: [id number] Date of Birth: [birth year]   Medicare Important Message Given:  Yes    Olegario MessierKathy A Hanny Elsberry 10/14/2015, 9:24 AM

## 2015-10-14 NOTE — Discharge Instructions (Signed)
Pt advised not to take any Insulin at home Keep log of sugars for PCP to review. Further insulin orders if needed by PCP Blood Glucose Monitoring, Adult Monitoring your blood glucose (also know as blood sugar) helps you to manage your diabetes. It also helps you and your health care provider monitor your diabetes and determine how well your treatment plan is working. WHY SHOULD YOU MONITOR YOUR BLOOD GLUCOSE?  It can help you understand how food, exercise, and medicine affect your blood glucose.  It allows you to know what your blood glucose is at any given moment. You can quickly tell if you are having low blood glucose (hypoglycemia) or high blood glucose (hyperglycemia).  It can help you and your health care provider know how to adjust your medicines.  It can help you understand how to manage an illness or adjust medicine for exercise. WHEN SHOULD YOU TEST? Your health care provider will help you decide how often you should check your blood glucose. This may depend on the type of diabetes you have, your diabetes control, or the types of medicines you are taking. Be sure to write down all of your blood glucose readings so that this information can be reviewed with your health care provider. See below for examples of testing times that your health care provider may suggest. Type 1 Diabetes  Test at least 2 times per day if your diabetes is well controlled, if you are using an insulin pump, or if you perform multiple daily injections.  If your diabetes is not well controlled or if you are sick, you may need to test more often.  It is a good idea to also test:  Before every insulin injection.  Before and after exercise.  Between meals and 2 hours after a meal.  Occasionally between 2:00 a.m. and 3:00 a.m. Type 2 Diabetes  If you are taking insulin, test at least 2 times per day. However, it is best to test before every insulin injection.  If you take medicines by mouth (orally), test  2 times a day.  If you are on a controlled diet, test once a day.  If your diabetes is not well controlled or if you are sick, you may need to monitor more often. HOW TO MONITOR YOUR BLOOD GLUCOSE Supplies Needed  Blood glucose meter.  Test strips for your meter. Each meter has its own strips. You must use the strips that go with your own meter.  A pricking needle (lancet).  A device that holds the lancet (lancing device).  A journal or log book to write down your results. Procedure  Wash your hands with soap and water. Alcohol is not preferred.  Prick the side of your finger (not the tip) with the lancet.  Gently milk the finger until a small drop of blood appears.  Follow the instructions that come with your meter for inserting the test strip, applying blood to the strip, and using your blood glucose meter. Other Areas to Get Blood for Testing Some meters allow you to use other areas of your body (other than your finger) to test your blood. These areas are called alternative sites. The most common alternative sites are:  The forearm.  The thigh.  The back area of the lower leg.  The palm of the hand. The blood flow in these areas is slower. Therefore, the blood glucose values you get may be delayed, and the numbers are different from what you would get from your fingers. Do  not use alternative sites if you think you are having hypoglycemia. Your reading will not be accurate. Always use a finger if you are having hypoglycemia. Also, if you cannot feel your lows (hypoglycemia unawareness), always use your fingers for your blood glucose checks. ADDITIONAL TIPS FOR GLUCOSE MONITORING  Do not reuse lancets.  Always carry your supplies with you.  All blood glucose meters have a 24-hour "hotline" number to call if you have questions or need help.  Adjust (calibrate) your blood glucose meter with a control solution after finishing a few boxes of strips. BLOOD GLUCOSE RECORD  KEEPING It is a good idea to keep a daily record or log of your blood glucose readings. Most glucose meters, if not all, keep your glucose records stored in the meter. Some meters come with the ability to download your records to your home computer. Keeping a record of your blood glucose readings is especially helpful if you are wanting to look for patterns. Make notes to go along with the blood glucose readings because you might forget what happened at that exact time. Keeping good records helps you and your health care provider to work together to achieve good diabetes management.    This information is not intended to replace advice given to you by your health care provider. Make sure you discuss any questions you have with your health care provider.   Document Released: 06/17/2003 Document Revised: 07/05/2014 Document Reviewed: 11/06/2012 Elsevier Interactive Patient Education [employer].

## 2015-10-15 NOTE — H&P (Addendum)
PATIENT NAME: Loretta Kaiser   MR#: [id number]  DATE OF BIRTH: 11/03/1945  DATE OF ADMISSION: 10/11/2015  PRIMARY CARE PHYSICIAN: Claretta Fraise, DO   REQUESTING/REFERRING PHYSICIAN: Dr. Cyril Loosen  CHIEF COMPLAINTS:  Found unresponsive at home.  HISTORY OF PRESENT ILLNESS Loretta Kaiser is a 70 y.o. female with a known history of chronic kidney disease stage V, hypertension, diabetes on insulin, history of coronary artery disease, CVA, comes to the emergency room after she was found unresponsive at home. EMS checked her sugar was 35. She received glucagon and an amp of D50. Currently during my evaluation blood sugar is 223 and patient is awake alert and oriented 2. She was also found to be hypothermic with temperature on arrival to the emergency room 91.2. There however was placed. Temperature currently is back to normal. Patient was empirically received IV Vanco and Zosyn. No specific infection identified so far. Patient ate some [place] sandwich and applesauce and emergency room. She is being admitted for hypothermia which likely is due to hypoglycemia.   Past Medical History  Diagnosis Date  . 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 Hypertension   . MI (myocardial infarction) (HCC)   . Arthritis   . CVA (cerebral infarction)   . Renal artery stenosis (HCC)   . CKD (chronic kidney disease), stage V (HCC)   . CAD (coronary artery disease), native coronary artery      Past Surgical History  Procedure Laterality Date  . Coronary angioplasty with stent placement    . Renal artery angioplasty      with stent placement  . Total vaginal hysterectomy       Social History  Substance Use Topics  . Smoking status: Former Smoker    Quit date: 06/28/1986  . Smokeless tobacco: Not on file  . Alcohol Use: No     Family History  Problem Relation Age of Onset  . Breast  cancer Mother   . Thyroid disease Mother   . Hypertension Father   . Heart disease Father   . Diabetes Sister   . Diabetes Brother   . Stroke Brother   . Thyroid disease Paternal Aunt   . Hyperlipidemia    . CAD       Allergies  Allergen Reactions  . Ace Inhibitors     hyperkalemia  . Angiotensin Receptor Blockers     hyperkalemia  . Lipitor [Atorvastatin]     Muscle aches  . Sulfa Antibiotics Hives  . Simvastatin Rash    Review of Systems  Constitutional: Negative for fever, chills and weight loss.  HENT: Negative for ear discharge, ear pain and nosebleeds.  Eyes: Negative for blurred vision, pain and discharge.  Respiratory: Negative for sputum production, shortness of breath, wheezing and stridor.  Cardiovascular: Negative for chest pain, palpitations, orthopnea and PND.  Gastrointestinal: Negative for nausea, vomiting, abdominal pain and diarrhea.  Genitourinary: Negative for urgency and frequency.  Musculoskeletal: Negative for back pain and joint pain.  Neurological: Positive for loss of consciousness and weakness. Negative for sensory change, speech change and focal weakness.  Psychiatric/Behavioral: Negative for depression and hallucinations. The patient is not nervous/anxious.  All other systems reviewed and are negative.     Prior to Admission medications   Medication Sig Start Date End Date Taking? Authorizing Provider  amLODipine (NORVASC) 10 MG tablet Take 10 mg by mouth daily.   Yes Historical Provider, MD  aspirin EC 81 MG tablet Take 81 mg by mouth daily.  Yes Historical Provider, MD  carvedilol (COREG) 12.5 MG tablet Take 12.5 mg by mouth 2 (two) times daily with a meal.   Yes Historical Provider, MD  donepezil (ARICEPT) 5 MG tablet Take 5 mg by mouth at bedtime.   Yes Historical Provider, MD  fluticasone (FLONASE) 50 MCG/ACT nasal spray Place  1 spray into both nostrils 2 (two) times daily. 09/27/13  Yes Historical Provider, MD  furosemide (LASIX) 20 MG tablet Take 20 mg by mouth daily.   Yes Historical Provider, MD  insulin glargine (LANTUS) 100 UNIT/ML injection Inject 40 Units into the skin every morning.    Yes Historical Provider, MD  insulin lispro (HUMALOG) 100 UNIT/ML injection Inject 12 Units into the skin 3 (three) times daily with meals.   Yes Historical Provider, MD  isosorbide mononitrate (IMDUR) 30 MG 24 hr tablet Take 1 tablet (30 mg total) by mouth daily. 11/27/14  Yes Katha Hamming, MD  mirtazapine (REMERON) 15 MG tablet Take 15 mg by mouth at bedtime.   Yes Historical Provider, MD  Multiple Vitamin (MULTI-VITAMINS) TABS Take 1 tablet by mouth daily.   Yes Historical Provider, MD  nitroGLYCERIN (NITROSTAT) 0.3 MG SL tablet Place 0.3 mg under the tongue every 5 (five) minutes x 3 doses as needed. For chest pain.Call 911 or MD if not improving after 3 tablets. 12/05/14  Yes Historical Provider, MD  rosuvastatin (CRESTOR) 20 MG tablet Take 20 mg by mouth every morning.    Yes Historical Provider, MD  cephALEXin (KEFLEX) 500 MG capsule Take 1 capsule (500 mg total) by mouth 2 (two) times daily. 08/04/15   Gayla Doss, MD    VITALS SIGNS Blood pressure 173/73, pulse 66, temperature 97.5 F (36.4 C), temperature source Rectal, resp. rate 28, height  (1.676 m), weight 75 kg (165 lb 5.5 oz), SpO2 97 %.  PHYSICAL EXAM GENERAL: 70 y.o.-year-old patient lying in the bed with no acute distress.  EYES: Pupils equal, round, reactive to light and accommodation. No scleral icterus. Extraocular muscles intact.  HEENT: Head atraumatic, normocephalic. Oropharynx and nasopharynx clear.  NECK: Supple, no jugular venous distention. No thyroid enlargement, no tenderness.  LUNGS: Normal breath sounds bilaterally, no wheezing, rales,rhonchi or crepitation. No use of  accessory muscles of respiration.  CARDIOVASCULAR: S1, S2 normal. No murmurs, rubs, or gallops.  ABDOMEN: Soft, nontender, nondistended. Bowel sounds present. No organomegaly or mass.  EXTREMITIES: No pedal edema, cyanosis, or clubbing.  NEUROLOGIC: Cranial nerves II through XII are intact. Muscle strength 5/5 in all extremities. Sensation intact. Gait not checked.  PSYCHIATRIC: The patient is alert and oriented x 3.  SKIN: No obvious rash, lesion, or ulcer.   LABORATORY PANEL:   CBC  Last Labs      Recent Labs Lab 10/11/15 2202  WBC 6.8  HGB 11.3*  HCT 34.9*  PLT 202     ------------------------------------------------------------------------------------------------------------------  Chemistries   Last Labs      Recent Labs Lab 10/11/15 2202  NA 139  K 3.9  CL 111  CO2 23  GLUCOSE 223*  BUN 45*  CREATININE 5.35*  CALCIUM 8.8*     ------------------------------------------------------------------------------------------------------------------  Cardiac Enzymes  Last Labs      Recent Labs Lab 10/11/15 2202  TROPONINI <0.03     ------------------------------------------------------------------------------------------------------------------  RADIOLOGY:   Imaging Results (Last 48 hours)    Dg Chest 2 View  10/11/2015 CLINICAL DATA: Altered mental status EXAM: CHEST 2 VIEW COMPARISON: 11/25/2014 chest radiograph. FINDINGS: Stable cardiomediastinal silhouette with mild cardiomegaly.  No pneumothorax. No pleural effusion. Cephalization of the pulmonary vasculature without overt pulmonary edema. No acute consolidative airspace disease. IMPRESSION: Stable mild cardiomegaly without overt pulmonary edema. No active pulmonary disease. Electronically Signed By: Delbert PhenixJason A Poff M.D. On: 10/11/2015 23:24   Ct Head Wo Contrast  10/12/2015 CLINICAL DATA: Patient found unresponsive, with hypoglycemia. Hypothermia. Initial  encounter. EXAM: CT HEAD WITHOUT CONTRAST TECHNIQUE: Contiguous axial images were obtained from the base of the skull through the vertex without intravenous contrast. COMPARISON: None. FINDINGS: There is no evidence of acute infarction, mass lesion, or intra- or extra-axial hemorrhage on CT. Prominence of ventricles and sulci reflects mild cortical volume loss. Mild periventricular and subcortical white matter change likely reflects small vessel ischemic microangiopathy. The brainstem and fourth ventricle are within normal limits. The basal ganglia are unremarkable in appearance. The cerebral hemispheres demonstrate grossly normal gray-white differentiation. No mass effect or midline shift is seen. There is no evidence of fracture; visualized osseous structures are unremarkable in appearance. The visualized portions of the orbits are within normal limits. Mucosal thickening is noted at the left maxillary sinus and left side of the sphenoid sinus. The remaining paranasal sinuses and mastoid air cells are well-aerated. No significant soft tissue abnormalities are seen. IMPRESSION: 1. No acute intracranial pathology seen on CT. 2. Mild cortical volume loss and scattered small vessel ischemic microangiopathy. 3. Mucosal thickening at the left maxillary sinus and left side of the sphenoid sinus. Electronically Signed By: Roanna RaiderJeffery Chang M.D. On: 10/12/2015 00:08    EKGEKG  Sinus rhythm, prolonged PR interval.   ASSESSMENT AND PLAN: Loretta Kaiser is a 70 y.o. female with a known history of chronic kidney disease stage V, hypertension, diabetes on insulin, history of coronary artery disease, CVA, comes to the emergency room after she was found unresponsive at home. EMS checked her sugar was 35. She received glucagon and an amp of D50. Currently during my evaluation blood sugar is 223 and patient is awake alert and oriented 2.  1. acute encephalopathy/unresponsiveness suspected due to hypoglycemia -Patient  was found unresponsive at home with blood sugar of 35. She is back to baseline. -She apparently was not eating well and also took her insulin which bottomed her sugars down given her history of chronic kidney disease -We'll keep her on sliding scale  2. Hypothermia likely due to hypoglycemia however need to rule out infection -Empirically on Zosyn and vancomycin. Will follow blood culture and urine culture. If infection workup is negative discontinue antibiotics in the morning   3. Chronic kidney disease stage V. [facility] -Her creatinine is at baseline. We'll consider nephrology consultation  4. Hypertension resume home meds  5. Type 2 diabetes insulin requiring Given hypoglycemia I'll continue sliding scale insulin and resume home dose insulin once patient is able to eat and tolerate diet  6. DVT prophylaxis subcutaneous heparin

## 2015-10-17 LAB — CULTURE, BLOOD (ROUTINE X 2)
CULTURE: NO GROWTH
Culture: NO GROWTH

## 2016-01-08 ENCOUNTER — Ambulatory Visit
Admission: EM | Admit: 2016-01-08 | Discharge: 2016-01-08 | Disposition: A | Discharge status: Home / Self Care | Payer: Medicare Other | Attending: Emergency Medicine | Admitting: Emergency Medicine

## 2016-01-08 ENCOUNTER — Ambulatory Visit (INDEPENDENT_AMBULATORY_CARE_PROVIDER_SITE_OTHER): Payer: Medicare Other

## 2016-01-08 DIAGNOSIS — R05 Cough: Secondary | ICD-10-CM

## 2016-01-08 DIAGNOSIS — R748 Abnormal levels of other serum enzymes: Secondary | ICD-10-CM

## 2016-01-08 DIAGNOSIS — N189 Chronic kidney disease, unspecified: Secondary | ICD-10-CM | POA: Diagnosis not present

## 2016-01-08 DIAGNOSIS — R059 Cough, unspecified: Secondary | ICD-10-CM

## 2016-01-08 DIAGNOSIS — R7989 Other specified abnormal findings of blood chemistry: Secondary | ICD-10-CM

## 2016-01-08 LAB — BASIC METABOLIC PANEL
Anion gap: 11 (ref 5–15)
BUN: 52 mg/dL — AB (ref 6–20)
CHLORIDE: 110 mmol/L (ref 101–111)
CO2: 17 mmol/L — AB (ref 22–32)
CREATININE: 5.82 mg/dL — AB (ref 0.44–1.00)
Calcium: 9 mg/dL (ref 8.9–10.3)
GFR calc Af Amer: 8 mL/min — ABNORMAL LOW (ref >60)
GFR calc non Af Amer: 7 mL/min — ABNORMAL LOW (ref >60)
GLUCOSE: 112 mg/dL — AB (ref 65–99)
POTASSIUM: 4.9 mmol/L (ref 3.5–5.1)
Sodium: 138 mmol/L (ref 135–145)

## 2016-01-08 LAB — CBC WITH DIFFERENTIAL/PLATELET
Basophils Absolute: 0.1 10*3/uL (ref 0–0.1)
Basophils Relative: 1 %
EOS PCT: 3 %
Eosinophils Absolute: 0.2 10*3/uL (ref 0–0.7)
HCT: 32.6 % — ABNORMAL LOW (ref 35.0–47.0)
Hemoglobin: 10.8 g/dL — ABNORMAL LOW (ref 12.0–16.0)
LYMPHS ABS: 1 10*3/uL (ref 1.0–3.6)
LYMPHS PCT: 14 %
MCH: 30.2 pg (ref 26.0–34.0)
MCHC: 33 g/dL (ref 32.0–36.0)
MCV: 91.5 fL (ref 80.0–100.0)
MONO ABS: 0.7 10*3/uL (ref 0.2–0.9)
MONOS PCT: 9 %
Neutro Abs: 5.2 10*3/uL (ref 1.4–6.5)
Neutrophils Relative %: 73 %
PLATELETS: 260 10*3/uL (ref 150–440)
RBC: 3.57 MIL/uL — AB (ref 3.80–5.20)
RDW: 13.7 % (ref 11.5–14.5)
WBC: 7.2 10*3/uL (ref 3.6–11.0)

## 2016-01-08 MED ORDER — IPRATROPIUM-ALBUTEROL 0.5-2.5 (3) MG/3ML IN SOLN
3.0000 mL | Freq: Once | RESPIRATORY_TRACT | Status: AC
  Administered 2016-01-08: 3 mL via RESPIRATORY_TRACT

## 2016-01-08 MED ORDER — ALBUTEROL SULFATE HFA 108 (90 BASE) MCG/ACT IN AERS: 1.0000 | INHALATION_SPRAY | Freq: Four times a day (QID) | RESPIRATORY_TRACT | Status: AC | PRN

## 2016-01-08 MED ORDER — AEROCHAMBER PLUS MISC: Status: AC

## 2016-01-08 NOTE — ED Notes (Signed)
Patient complains of chest congestion, and cough since yesterday. Denies a productive cough or fever.

## 2016-01-08 NOTE — ED Provider Notes (Signed)
HPI  SUBJECTIVE:  Loretta Kaiser is a 70 y.o. female who presents with a nonproductive cough, chest congestion and "not feeling well" starting yesterday. She reports some rhinorrhea, questionable postnasal drip. She reports wheezing, shortness of breath with coughing. She reports some mild dyspnea on exertion and nocturia for the past several days. States that she is urinating 3 or 4 times a night, and states that she normally doesn't need to get up and use the restroom at night. Symptoms are worse with coughing. No alleviating factors. She has not tried anything for this. She denies nasal congestion, fevers, chest pain. No antipyretic in past 6-8 hours, no recent antibiotics. No known sick contacts. States that her glucose was within normal limits yesterday. She denies lower extremity edema, unintentional weight gain, PND, orthopnea.  Patient with a past medical history of diabetes for which she takes insulin. She is not on any oral hypoglycemics. Also has a history of chronic kidney disease stage V, hypertension, MI, stroke, asthma. She is a former smoker, quit in 1998.  She is followed at [facility] nephrology for her chronic kidney disease. States that her cardiologist is at [facility] as well. PMD: [facility].    Past Medical History  Diagnosis Date  . 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 Hypertension   . MI (myocardial infarction) (HCC)   . Arthritis   . CVA (cerebral infarction)   . Renal artery stenosis (HCC)   . CKD (chronic kidney disease), stage V (HCC)   . CAD (coronary artery disease), native coronary artery     Past Surgical History  Procedure Laterality Date  . Coronary angioplasty with stent placement    . Renal artery angioplasty      with stent placement  . Total vaginal hysterectomy      Family History  Problem Relation Age of Onset  . Breast cancer Mother   . Thyroid disease Mother   . Hypertension Father   . Heart disease Father   .  Diabetes Sister   . Diabetes Brother   . Stroke Brother   . Thyroid disease Paternal Aunt   . Hyperlipidemia    . CAD      Social History  Substance Use Topics  . Smoking status: Former Smoker    Quit date: 06/28/1986  . Smokeless tobacco: None  . Alcohol Use: No    No current facility-administered medications for this encounter.  Current outpatient prescriptions:  .  amLODipine (NORVASC) 10 MG tablet, Take 10 mg by mouth daily., Disp: , Rfl:  .  aspirin EC 81 MG tablet, Take 81 mg by mouth daily., Disp: , Rfl:  .  carvedilol (COREG) 12.5 MG tablet, Take 12.5 mg by mouth 2 (two) times daily with a meal., Disp: , Rfl:  .  fluticasone (FLONASE) 50 MCG/ACT nasal spray, Place 1 spray into both nostrils 2 (two) times daily., Disp: , Rfl:  .  mirtazapine (REMERON) 15 MG tablet, Take 15 mg by mouth at bedtime., Disp: , Rfl:  .  rosuvastatin (CRESTOR) 20 MG tablet, Take 20 mg by mouth every morning. , Disp: , Rfl:  .  [DISCONTINUED] furosemide (LASIX) 20 MG tablet, Take 1 tablet (20 mg total) by mouth every other day., Disp: 30 tablet, Rfl: 2 .  albuterol (PROVENTIL HFA;VENTOLIN HFA) 108 (90 Base) MCG/ACT inhaler, Inhale 1-2 puffs into the lungs every 6 (six) hours as needed for wheezing or shortness of breath., Disp: 1 Inhaler, Rfl: 0 .  donepezil (ARICEPT) 5 MG tablet, Take 5 mg by mouth at bedtime., Disp: , Rfl:  .  isosorbide mononitrate (IMDUR) 30 MG 24 hr tablet, Take 1 tablet (30 mg total) by mouth daily., Disp: 30 tablet, Rfl: 0 .  Multiple Vitamin (MULTI-VITAMINS) TABS, Take 1 tablet by mouth daily., Disp: , Rfl:  .  nitroGLYCERIN (NITROSTAT) 0.3 MG SL tablet, Place 0.3 mg under the tongue every 5 (five) minutes x 3 doses as needed. For chest pain.Call 911 or MD if not improving after 3 tablets., Disp: , Rfl:  .  Spacer/Aero-Holding Chambers (AEROCHAMBER PLUS) inhaler, Use as instructed, Disp: 1 each, Rfl: 2  Allergies  Allergen Reactions  . Ace Inhibitors     hyperkalemia  .  Angiotensin Receptor Blockers     hyperkalemia  . Lipitor [Atorvastatin]     Muscle aches  . Sulfa Antibiotics Hives  . Simvastatin Rash     ROS  As noted in HPI.   Physical Exam  BP 151/66 mmHg  Pulse 91  Temp(Src) 98.8 F (37.1 C) (Oral)  Resp 17  Ht 5\' 6"  (1.676 m)  Wt 135 lb (61.236 kg)  BMI 21.80 kg/m2  SpO2 100%  Constitutional: Well developed, well nourished, no acute distress Eyes: PERRL, EOMI, conjunctiva normal bilaterally HENT: Normocephalic, atraumatic,mucus membranes moist Neck: no JVD Respiratory: Crackles bilaterally, on the right more than left, rales right side, occ wheezing Cardiovascular: Normal rate and rhythm, loud holosystolic murmur.  GI:  nondistended, no hepatomegaly or tenderness. skin: No rash, skin intact Musculoskeletal: Calves symmetric, nontender, No edema, no tenderness, no deformities Neurologic: Alert & oriented x 3, CN II-XII grossly intact, no motor deficits, sensation grossly intact Psychiatric: Speech and behavior appropriate   ED Course   Medications  ipratropium-albuterol (DUONEB) 0.5-2.5 (3) MG/3ML nebulizer solution 3 mL (3 mLs Nebulization Given 01/08/16 1622)    Orders Placed This Encounter  Procedures  . DG Chest 2 View    Standing Status: Standing     Number of Occurrences: 1     Standing Expiration Date:     Order Specific Question:  Reason for Exam (SYMPTOM  OR DIAGNOSIS REQUIRED)    Answer:  cough r/o PNA CHF  . Basic metabolic panel    Standing Status: Standing     Number of Occurrences: 1     Standing Expiration Date:   . CBC with Differential    Standing Status: Standing     Number of Occurrences: 1     Standing Expiration Date:    Results for orders placed or performed during the hospital encounter of 01/08/16 (from the past 24 hour(s))  Basic metabolic panel     Status: Abnormal   Collection Time: 01/08/16  4:06 PM  Result Value Ref Range   Sodium 138 135 - 145 mmol/L   Potassium 4.9 3.5 - 5.1  mmol/L   Chloride 110 101 - 111 mmol/L   CO2 17 (L) 22 - 32 mmol/L   Glucose, Bld 112 (H) 65 - 99 mg/dL   BUN 52 (H) 6 - 20 mg/dL   Creatinine, Ser 0.985.82 (H) 0.44 - 1.00 mg/dL   Calcium 9.0 8.9 - 11.910.3 mg/dL   GFR calc non Af Amer 7 (L) >60 mL/min   GFR calc Af Amer 8 (L) >60 mL/min   Anion gap 11 5 - 15  CBC with Differential     Status: Abnormal   Collection Time: 01/08/16  4:06 PM  Result Value Ref Range   WBC 7.2  3.6 - 11.0 K/uL   RBC 3.57 (L) 3.80 - 5.20 MIL/uL   Hemoglobin 10.8 (L) 12.0 - 16.0 g/dL   HCT 16.1 (L) 09.6 - 04.5 %   MCV 91.5 80.0 - 100.0 fL   MCH 30.2 26.0 - 34.0 pg   MCHC 33.0 32.0 - 36.0 g/dL   RDW 40.9 81.1 - 91.4 %   Platelets 260 150 - 440 K/uL   Neutrophils Relative % 73 %   Neutro Abs 5.2 1.4 - 6.5 K/uL   Lymphocytes Relative 14 %   Lymphs Abs 1.0 1.0 - 3.6 K/uL   Monocytes Relative 9 %   Monocytes Absolute 0.7 0.2 - 0.9 K/uL   Eosinophils Relative 3 %   Eosinophils Absolute 0.2 0 - 0.7 K/uL   Basophils Relative 1 %   Basophils Absolute 0.1 0 - 0.1 K/uL   Dg Chest 2 View  01/08/2016  CLINICAL DATA:  70 year old female with cough and congestion for 1 day. EXAM: CHEST  2 VIEW COMPARISON:  10/11/2015 and prior radiograph FINDINGS: Cardiomegaly and aortic atherosclerotic calcifications noted. There is no evidence of focal airspace disease, pulmonary edema, suspicious pulmonary nodule/mass, pleural effusion, or pneumothorax. No acute bony abnormalities are identified. IMPRESSION: No evidence of acute cardiopulmonary disease. Cardiomegaly and aortic atherosclerosis. Electronically Signed   By: Harmon Pier M.D.   On: 01/08/2016 16:12    ED Clinical Impression  Cough  Elevated serum creatinine  Chronic kidney disease, unspecified stage  ED Assessment/Plan  Checking chest x-ray to rule out pneumonia versus CHF. We'll also check CBC, BMP she is a diabetic and has history of chronic kidney disease. Giving DuoNeb although the patient is in no respiratory  distress however she is coughing. We will reevaluate.  Previous records reviewed. She had an echo with an EF of 55% and no high-grade valvular abnormalities in 2016.  Patient states that the murmur is not new and has been evaluated before.  Reviewed imaging independently. Cardiomegaly. No effusion, pulmonary edema, consolidation. See radiology report for details.   Hemoglobin/hematocrit stable.  Baseline creatinine over the past year has ranged in between 4.7- 5.48. Creatinine today 5.8, however patient is still making urine.  Potassium is normal. She notices a slight worsening of her chronic renal failure, but feel that she is stable to be seen as an outpatient for this at this time.   Reevaluation, patient states she feels better. Repeat Exam, wheezing, crackles in the right lung, left lung is clear.   Patient states that she can follow-up with her nephrologist in 1-2 days regarding her kidney function. Advised increase fluids. Patient states that she is not taking the Lasix. Discussed labs, imaging, MDM, plan and followup with patient. Gave patient very  ER strict return precautions. Patient agrees with plan.   *This clinic note was created using Dragon dictation software. Therefore, there may be occasional mistakes despite careful proofreading.  ?  Domenick Gong, MD 01/08/16 1752

## 2016-01-08 NOTE — Discharge Instructions (Signed)
Follow-up with your nephrologist in the next day or 2. You need to have your kidneys reevaluated. drink extra fluids. Make sure that you eat on a regular basis Do the albuterol 2 puffs every 4-6 hours as needed for coughing and wheezing. Go to the ER for the signs and symptoms we discussed.

## 2016-11-16 ENCOUNTER — Ambulatory Visit
Admission: EM | Admit: 2016-11-16 | Discharge: 2016-11-16 | Disposition: A | Discharge status: Other Acute Inpatient Hospital | Payer: Medicare HMO | Attending: Family Medicine | Admitting: Family Medicine

## 2016-11-16 DIAGNOSIS — D649 Anemia, unspecified: Secondary | ICD-10-CM

## 2016-11-16 DIAGNOSIS — R531 Weakness: Secondary | ICD-10-CM | POA: Diagnosis not present

## 2016-11-16 DIAGNOSIS — R5383 Other fatigue: Secondary | ICD-10-CM

## 2016-11-16 DIAGNOSIS — K829 Disease of gallbladder, unspecified: Secondary | ICD-10-CM

## 2016-11-16 DIAGNOSIS — N189 Chronic kidney disease, unspecified: Secondary | ICD-10-CM

## 2016-11-16 DIAGNOSIS — R1011 Right upper quadrant pain: Secondary | ICD-10-CM

## 2016-11-16 LAB — CBC WITH DIFFERENTIAL/PLATELET
Basophils Absolute: 0 10*3/uL (ref 0–0.1)
Basophils Relative: 0 %
EOS ABS: 0.1 10*3/uL (ref 0–0.7)
EOS PCT: 4 %
HCT: 25.9 % — ABNORMAL LOW (ref 35.0–47.0)
Hemoglobin: 8.6 g/dL — ABNORMAL LOW (ref 12.0–16.0)
LYMPHS ABS: 1 10*3/uL (ref 1.0–3.6)
Lymphocytes Relative: 24 %
MCH: 31.1 pg (ref 26.0–34.0)
MCHC: 33.2 g/dL (ref 32.0–36.0)
MCV: 93.7 fL (ref 80.0–100.0)
MONO ABS: 0.3 10*3/uL (ref 0.2–0.9)
MONOS PCT: 8 %
Neutro Abs: 2.5 10*3/uL (ref 1.4–6.5)
Neutrophils Relative %: 64 %
PLATELETS: 200 10*3/uL (ref 150–440)
RBC: 2.76 MIL/uL — AB (ref 3.80–5.20)
RDW: 14.8 % — AB (ref 11.5–14.5)
WBC: 3.9 10*3/uL (ref 3.6–11.0)

## 2016-11-16 LAB — COMPREHENSIVE METABOLIC PANEL
ALT: 7 U/L — ABNORMAL LOW (ref 14–54)
ANION GAP: 7 (ref 5–15)
AST: 14 U/L — ABNORMAL LOW (ref 15–41)
Albumin: 3.4 g/dL — ABNORMAL LOW (ref 3.5–5.0)
Alkaline Phosphatase: 97 U/L (ref 38–126)
BILIRUBIN TOTAL: 0.4 mg/dL (ref 0.3–1.2)
BUN: 54 mg/dL — AB (ref 6–20)
CO2: 22 mmol/L (ref 22–32)
CREATININE: 7 mg/dL — AB (ref 0.44–1.00)
Calcium: 8.5 mg/dL — ABNORMAL LOW (ref 8.9–10.3)
Chloride: 112 mmol/L — ABNORMAL HIGH (ref 101–111)
GFR calc non Af Amer: 5 mL/min — ABNORMAL LOW (ref >60)
GFR, EST AFRICAN AMERICAN: 6 mL/min — AB (ref >60)
GLUCOSE: 118 mg/dL — AB (ref 65–99)
POTASSIUM: 4.4 mmol/L (ref 3.5–5.1)
Sodium: 141 mmol/L (ref 135–145)
Total Protein: 6.5 g/dL (ref 6.5–8.1)

## 2016-11-16 LAB — AMYLASE: AMYLASE: 133 U/L — AB (ref 28–100)

## 2016-11-16 LAB — LIPASE, BLOOD: LIPASE: 18 U/L (ref 11–51)

## 2016-11-16 NOTE — ED Provider Notes (Signed)
[facility]    CSN: [id number] Arrival date & time: 11/16/16  1547     History   Chief Complaint Chief Complaint  Patient presents with  . Fatigue    HPI Loretta Kaiser is a 71 y.o. female.   Patient's here because of couple of problems.   #1% reports increased fatigue and tiredness she stopped taking her medicines about 2 weeks ago. Since they sees more involved in her day-to-day activity and is Her back on a medication. Since she's been put back on medication he still is increased lethargy and fatigue. She is on multiple medications and she does have a history of dementia he denies dementia but she is prescribed Aricept.    #2 patient states she's been having abdominal pain for about a week percent reports that she does complain of abdominal pain this afternoon. This is after she ate some chicken [employer] nuggets and a few the french fries. According to him she still complained of abdominal pain. "Patient the pain goes were abdomen to the back. She denies any nausea vomiting states she still having some discomfort. According to son she's had a history of dementia hypertension chronic kidney disease CVAs and a history of myocardial infarction past hypertension and hyperlipidemia as well as coronary artery disease. She is a former smoker she's allergic to ace's, Lipitor and Zocor, sulfur. She is a former smoker. No pertinent family medical history relevant to today's visit       The history is provided by the patient and a relative. The history is limited by the condition of the patient. No language interpreter was used.  Abdominal Pain  Pain location:  RUQ Pain quality: aching, cramping, gnawing, sharp and stabbing   Pain radiates to:  Back Pain severity:  Moderate Onset quality:  Sudden Timing:  Constant Progression:  Waxing and waning Chronicity:  New Context: eating   Worsened by:  Nothing Ineffective treatments:  None tried Associated symptoms: nausea     Risk factors: being elderly     Past Medical History:  Diagnosis Date  . Arthritis   . CAD (coronary artery disease), native coronary artery   . CKD (chronic kidney disease), stage V (HCC)   . CVA (cerebral infarction)   . Hypertension   . MI (myocardial infarction) (HCC)   . Renal artery stenosis (HCC)   . 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 Hypoglycemia 10/12/2015  . Unstable angina (HCC) 11/26/2014  . 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 CAD (coronary artery disease), native coronary artery 11/26/2014  . Benign essential HTN 11/26/2014  . HLD (hyperlipidemia) 11/26/2014    Past Surgical History:  Procedure Laterality Date  . CORONARY ANGIOPLASTY WITH STENT PLACEMENT    . RENAL ARTERY ANGIOPLASTY     with stent placement  . TOTAL VAGINAL HYSTERECTOMY      OB History    No data available       Home Medications    Prior to Admission medications   Medication Sig Start Date End Date Taking? Authorizing Provider  amLODipine (NORVASC) 10 MG tablet Take 10 mg by mouth daily.   Yes [provider]  carvedilol (COREG) 12.5 MG tablet Take 12.5 mg by mouth 2 (two) times daily with a meal.   Yes [provider]  donepezil (ARICEPT) 5 MG tablet Take 5 mg by mouth at  bedtime.   Yes [provider]  mirtazapine (REMERON) 15 MG tablet Take 15 mg by mouth at bedtime.   Yes [provider]  Multiple Vitamin (MULTI-VITAMINS) TABS Take 1 tablet by mouth daily.   Yes [provider]  rosuvastatin (CRESTOR) 20 MG tablet Take 20 mg by mouth every morning.    Yes [provider]  sodium polystyrene sulfonate (SPS) 30 GM/120ML SUSP Place rectally.   Yes [provider]  albuterol (PROVENTIL HFA;VENTOLIN HFA) 108 (90 Base) MCG/ACT inhaler Inhale 1-2 puffs into the lungs every 6 (six) hours as needed for  wheezing or shortness of breath. 01/08/16   Domenick Gong, MD  aspirin EC 81 MG tablet Take 81 mg by mouth daily.    [provider]  fluticasone (FLONASE) 50 MCG/ACT nasal spray Place 1 spray into both nostrils 2 (two) times daily. 09/27/13   [provider]  isosorbide mononitrate (IMDUR) 30 MG 24 hr tablet Take 1 tablet (30 mg total) by mouth daily. 11/27/14   Katha Hamming, MD  nitroGLYCERIN (NITROSTAT) 0.3 MG SL tablet Place 0.3 mg under the tongue every 5 (five) minutes x 3 doses as needed. For chest pain.Call 911 or MD if not improving after 3 tablets. 12/05/14   [provider]  Spacer/Aero-Holding Chambers (AEROCHAMBER PLUS) inhaler Use as instructed 01/08/16   Domenick Gong, MD    Family History Family History  Problem Relation Age of Onset  . Breast cancer Mother   . Thyroid disease Mother   . Hypertension Father   . Heart disease Father   . Diabetes Sister   . Diabetes Brother   . Stroke Brother   . Thyroid disease Paternal Aunt   . Hyperlipidemia Unknown   . CAD Unknown     Social History Social History  Substance Use Topics  . Smoking status: Former Smoker    Quit date: 06/28/1986  . Smokeless tobacco: Never Used  . Alcohol use No     Allergies   Ace inhibitors; Angiotensin receptor blockers; Lipitor [atorvastatin]; Sulfa antibiotics; and Simvastatin   Review of Systems Review of Systems  Unable to perform ROS: Dementia  Gastrointestinal: Positive for abdominal pain and nausea.     Physical Exam Triage Vital Signs ED Triage Vitals [11/16/16 1613]  Enc Vitals Group     BP (!) 148/52     Pulse Rate (!) 57     Resp 18     Temp 99.3 F (37.4 C)     Temp Source Oral     SpO2 98 %     Weight      Height      Head Circumference      Peak Flow      Pain Score 1     Pain Loc      Pain Edu?      Excl. in GC?    No data found.   Updated Vital Signs BP (!) 148/52 (BP Location: Left Arm)   Pulse (!) 57   Temp 99.3  F (37.4 C) (Oral)   Resp 18   SpO2 98%   Visual Acuity Right Eye Distance:   Left Eye Distance:   Bilateral Distance:    Right Eye Near:   Left Eye Near:    Bilateral Near:     Physical Exam  Constitutional: She is oriented to person, place, and time. She appears well-developed and well-nourished. No distress.  HENT:  Head: Normocephalic and atraumatic.  Right Ear: External ear normal.  Left Ear: External ear normal.  Eyes: Pupils are equal, round, and reactive to light.  Neck: Normal range of motion. Neck supple.  Cardiovascular: Normal rate and regular rhythm.   Murmur heard.  Systolic murmur is present with a grade of 5/6  Pulmonary/Chest: Effort normal.  Abdominal: Soft. She exhibits no distension. There is tenderness.  Musculoskeletal: Normal range of motion.  Neurological: She is alert and oriented to person, place, and time. No cranial nerve deficit.  Skin: Skin is warm and dry. She is not diaphoretic.  Psychiatric: She has a normal mood and affect.  Vitals reviewed.    UC Treatments / Results  Labs (all labs ordered are listed, but only abnormal results are displayed) Labs Reviewed  AMYLASE  COMPREHENSIVE METABOLIC PANEL  CBC WITH DIFFERENTIAL/PLATELET  LIPASE, BLOOD    EKG  EKG Interpretation None       Radiology No results found.  Procedures Procedures (including critical care time)  Medications Ordered in UC Medications - No data to display   Initial Impression / Assessment and Plan / UC Course  I have reviewed the triage vital signs and the nursing notes.  Pertinent labs & imaging results that were available during my care of the patient were reviewed by me and considered in my medical decision making (see chart for details).   for the fatigue. Reviewed her medications she is on Remeron obvious having fatigue and recommending we stopped the Remeron at this point if she starts having difficulty sleeping and I will try half a tablet. When  his son she has no trouble sleeping at night this may benefit something that she complained about before and on old treatment for remedy or problem has since passed. For the abdominal pain going to place her on no medication  and obtain ultrasound of the gallbladder if amylase lipase CBC and CMP is normal. The follow-up PCP as needed.   Final Clinical Impressions(s) / UC Diagnoses   Final diagnoses:  Fatigue, unspecified type  Right upper quadrant abdominal pain  Gallbladder pain    New Prescriptions New Prescriptions   No medications on file  After labs returned turns out the patient is anemic and she also has renal failure. She has extensive renal failure. Son did not mention these things to me. With amount of anemia that she has and the mild renal failure that she has and with this being a definite deterioration of what the levels were before I'm recommending they [facility] be evaluated she may need be admitted and be placed on dialysis she may need to have upper endoscopy to make sure there is no also present and a Hemoccult done and possible colonoscopy. Patient states she's had colonoscopy but because of her dementia I can't trust what she says this time without documentation     Note: This dictation was prepared with Dragon dictation along with smaller phrase technology. Any transcriptional errors that result from this process are unintentional.   Hassan RowanWade, Seth Friedlander, MD 11/16/16 1907

## 2016-11-16 NOTE — ED Triage Notes (Signed)
Patient complains of weakness and fatigue. Patient states that this started on 1 week ago. Patient states that she recently just went back on all medications since her dementia caused her to not remember to take medications. Patient states that she had stomach pain this morning but doesn't report that now.

## 2016-12-28 ENCOUNTER — Encounter (INDEPENDENT_AMBULATORY_CARE_PROVIDER_SITE_OTHER): Payer: Self-pay

## 2016-12-28 ENCOUNTER — Ambulatory Visit (INDEPENDENT_AMBULATORY_CARE_PROVIDER_SITE_OTHER): Payer: Self-pay | Admitting: Vascular Surgery

## 2017-01-03 ENCOUNTER — Other Ambulatory Visit (INDEPENDENT_AMBULATORY_CARE_PROVIDER_SITE_OTHER): Payer: Self-pay | Admitting: Internal Medicine

## 2017-01-03 DIAGNOSIS — N185 Chronic kidney disease, stage 5: Secondary | ICD-10-CM

## 2017-01-04 ENCOUNTER — Encounter (INDEPENDENT_AMBULATORY_CARE_PROVIDER_SITE_OTHER): Payer: Medicare HMO

## 2017-01-04 ENCOUNTER — Ambulatory Visit (INDEPENDENT_AMBULATORY_CARE_PROVIDER_SITE_OTHER): Payer: Self-pay | Admitting: Vascular Surgery

## 2017-01-14 ENCOUNTER — Ambulatory Visit (INDEPENDENT_AMBULATORY_CARE_PROVIDER_SITE_OTHER): Payer: Self-pay | Admitting: Vascular Surgery

## 2017-01-14 ENCOUNTER — Encounter (INDEPENDENT_AMBULATORY_CARE_PROVIDER_SITE_OTHER): Payer: Medicare HMO

## 2017-02-15 ENCOUNTER — Ambulatory Visit (INDEPENDENT_AMBULATORY_CARE_PROVIDER_SITE_OTHER): Payer: Medicare HMO | Admitting: Vascular Surgery

## 2017-02-15 ENCOUNTER — Encounter (INDEPENDENT_AMBULATORY_CARE_PROVIDER_SITE_OTHER): Payer: Medicare HMO

## 2017-02-22 ENCOUNTER — Ambulatory Visit (INDEPENDENT_AMBULATORY_CARE_PROVIDER_SITE_OTHER): Payer: Medicare HMO | Admitting: Vascular Surgery

## 2017-03-28 DEATH — deceased

## 2017-04-11 IMAGING — CR DG CHEST 2V
2 series · 2 of 2 positions shown · non-contrast
Comparison: 10/11/2015 and prior radiograph

CLINICAL DATA: 70-year-old female with cough and congestion for 1
day.

EXAM:
CHEST  2 VIEW

[chest pa]
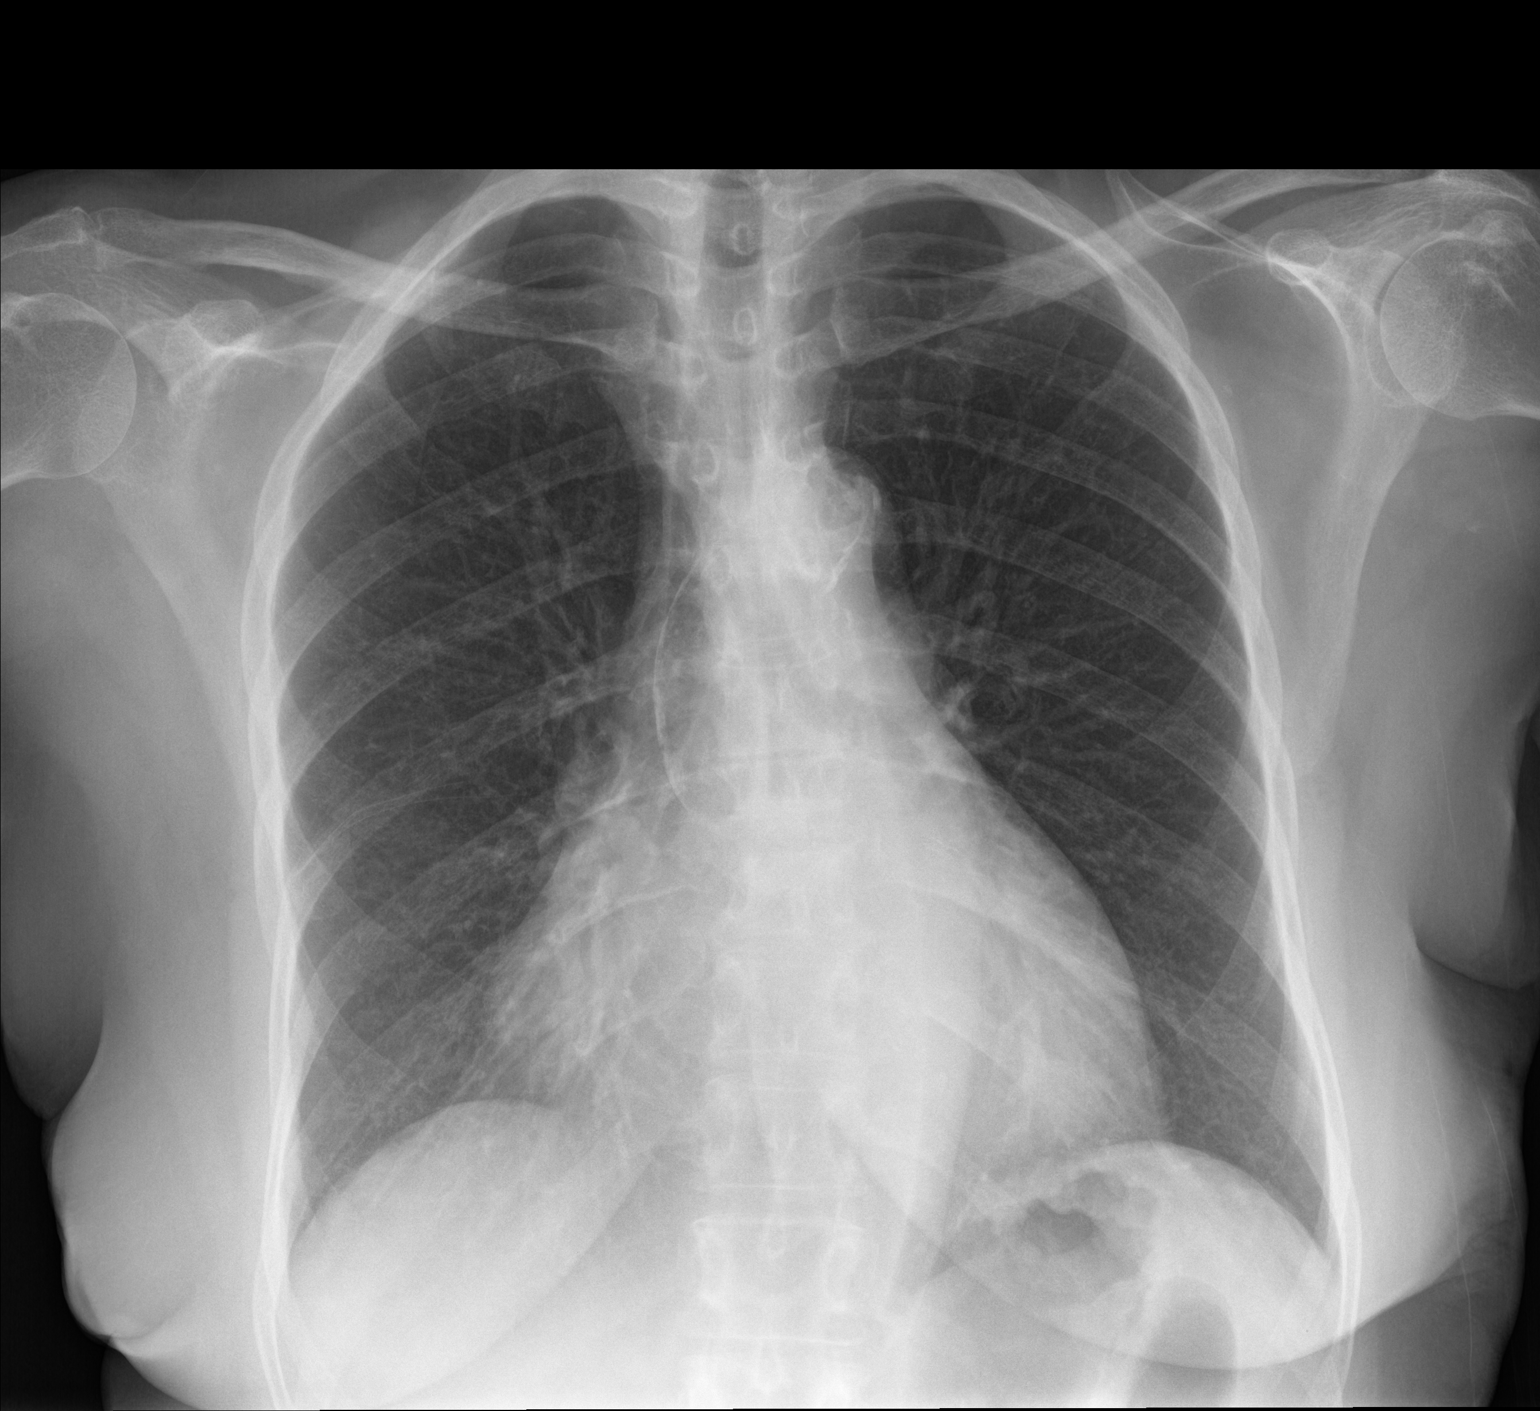

[chest lat]
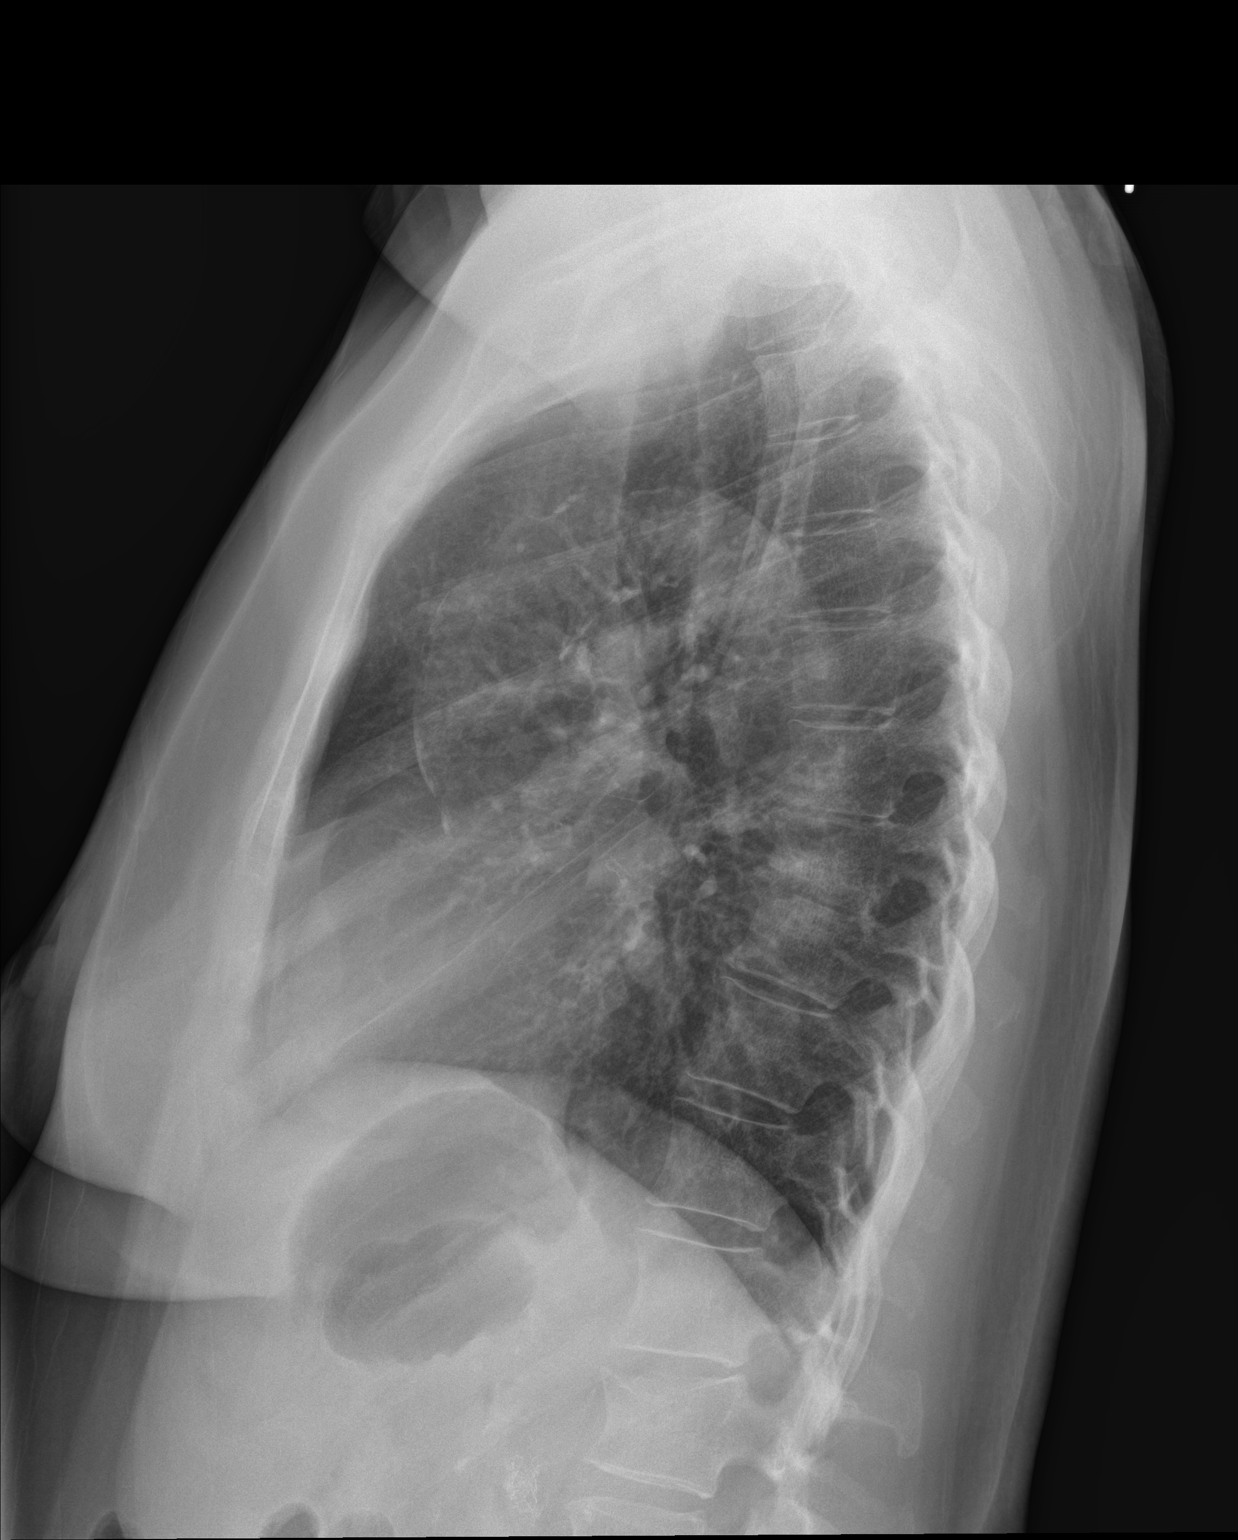

[2 of 2 positions shown; findings below may reference images not displayed]

FINDINGS: Cardiomegaly and aortic atherosclerotic calcifications noted.

There is no evidence of focal airspace disease, pulmonary edema,
suspicious pulmonary nodule/mass, pleural effusion, or pneumothorax.
No acute bony abnormalities are identified.
IMPRESSION: No evidence of acute cardiopulmonary disease.

Cardiomegaly and aortic atherosclerosis.
